# Patient Record
Sex: Male | Born: 1970 | Race: White | Hispanic: No | Marital: Married | Smoking: Never smoker
Health system: Southern US, Community
[De-identification: ages and names within clinical notes are randomized; demographics above are authoritative.]

## PROBLEM LIST (undated history)

## (undated) DIAGNOSIS — Z8774 Personal history of (corrected) congenital malformations of heart and circulatory system: Secondary | ICD-10-CM

## (undated) DIAGNOSIS — Z950 Presence of cardiac pacemaker: Secondary | ICD-10-CM

## (undated) DIAGNOSIS — I459 Conduction disorder, unspecified: Secondary | ICD-10-CM

## (undated) DIAGNOSIS — Q203 Discordant ventriculoarterial connection: Secondary | ICD-10-CM

## (undated) HISTORY — PX: EXPLORATION POST OPERATIVE OPEN HEART: SHX5061

## (undated) HISTORY — PX: CARDIAC SURGERY: SHX584

---

## 2013-08-24 ENCOUNTER — Emergency Department: Payer: Self-pay | Admitting: Emergency Medicine

## 2014-01-02 ENCOUNTER — Emergency Department: Payer: Self-pay | Admitting: Emergency Medicine

## 2014-01-02 LAB — BASIC METABOLIC PANEL
Anion Gap: 6 — ABNORMAL LOW (ref 7–16)
BUN: 19 mg/dL — ABNORMAL HIGH (ref 7–18)
CREATININE: 1.15 mg/dL (ref 0.60–1.30)
Calcium, Total: 9 mg/dL (ref 8.5–10.1)
Chloride: 108 mmol/L — ABNORMAL HIGH (ref 98–107)
Co2: 28 mmol/L (ref 21–32)
EGFR (Non-African Amer.): >60
Glucose: 104 mg/dL — ABNORMAL HIGH (ref 65–99)
OSMOLALITY: 286 (ref 275–301)
POTASSIUM: 4.4 mmol/L (ref 3.5–5.1)
Sodium: 142 mmol/L (ref 136–145)

## 2014-01-02 LAB — URIC ACID: Uric Acid: 7.7 mg/dL — ABNORMAL HIGH (ref 3.5–7.2)

## 2014-01-02 LAB — CBC
HCT: 46.5 % (ref 40.0–52.0)
HGB: 15.3 g/dL (ref 13.0–18.0)
MCH: 30.9 pg (ref 26.0–34.0)
MCHC: 33 g/dL (ref 32.0–36.0)
MCV: 93 fL (ref 80–100)
PLATELETS: 118 10*3/uL — AB (ref 150–440)
RBC: 4.97 10*6/uL (ref 4.40–5.90)
RDW: 13.8 % (ref 11.5–14.5)
WBC: 6.9 10*3/uL (ref 3.8–10.6)

## 2014-11-30 ENCOUNTER — Emergency Department: Payer: Medicaid Other

## 2014-11-30 ENCOUNTER — Emergency Department
Admission: EM | Admit: 2014-11-30 | Discharge: 2014-11-30 | Disposition: A | Discharge status: Other Acute Inpatient Hospital | Payer: Medicaid Other | Attending: Emergency Medicine | Admitting: Emergency Medicine

## 2014-11-30 ENCOUNTER — Other Ambulatory Visit: Payer: Self-pay

## 2014-11-30 ENCOUNTER — Ambulatory Visit (HOSPITAL_COMMUNITY)
Admission: AD | Admit: 2014-11-30 | Discharge: 2014-11-30 | Disposition: A | Discharge status: Home / Self Care | Payer: Medicaid Other | Source: Other Acute Inpatient Hospital | Attending: Emergency Medicine | Admitting: Emergency Medicine

## 2014-11-30 ENCOUNTER — Encounter: Payer: Self-pay | Admitting: Emergency Medicine

## 2014-11-30 DIAGNOSIS — Z79899 Other long term (current) drug therapy: Secondary | ICD-10-CM | POA: Insufficient documentation

## 2014-11-30 DIAGNOSIS — I442 Atrioventricular block, complete: Secondary | ICD-10-CM | POA: Insufficient documentation

## 2014-11-30 DIAGNOSIS — R252 Cramp and spasm: Secondary | ICD-10-CM | POA: Diagnosis not present

## 2014-11-30 DIAGNOSIS — M79652 Pain in left thigh: Secondary | ICD-10-CM | POA: Insufficient documentation

## 2014-11-30 DIAGNOSIS — R079 Chest pain, unspecified: Secondary | ICD-10-CM | POA: Diagnosis present

## 2014-11-30 LAB — COMPREHENSIVE METABOLIC PANEL
ALBUMIN: 3.8 g/dL (ref 3.5–5.0)
ALT: 26 U/L (ref 17–63)
AST: 33 U/L (ref 15–41)
Alkaline Phosphatase: 57 U/L (ref 38–126)
Anion gap: 10 (ref 5–15)
BUN: 18 mg/dL (ref 6–20)
CHLORIDE: 103 mmol/L (ref 101–111)
CO2: 23 mmol/L (ref 22–32)
CREATININE: 1.22 mg/dL (ref 0.61–1.24)
Calcium: 8.7 mg/dL — ABNORMAL LOW (ref 8.9–10.3)
GFR calc Af Amer: >60 mL/min (ref >60)
GLUCOSE: 123 mg/dL — AB (ref 65–99)
POTASSIUM: 4 mmol/L (ref 3.5–5.1)
Sodium: 136 mmol/L (ref 135–145)
Total Bilirubin: 1.4 mg/dL — ABNORMAL HIGH (ref 0.3–1.2)
Total Protein: 6.9 g/dL (ref 6.5–8.1)

## 2014-11-30 LAB — CBC WITH DIFFERENTIAL/PLATELET
BASOS ABS: 0.1 10*3/uL (ref 0–0.1)
BASOS PCT: 1 %
EOS PCT: 1 %
Eosinophils Absolute: 0.1 10*3/uL (ref 0–0.7)
HCT: 44.2 % (ref 40.0–52.0)
Hemoglobin: 15.2 g/dL (ref 13.0–18.0)
LYMPHS PCT: 15 %
Lymphs Abs: 1.1 10*3/uL (ref 1.0–3.6)
MCH: 30.5 pg (ref 26.0–34.0)
MCHC: 34.3 g/dL (ref 32.0–36.0)
MCV: 89 fL (ref 80.0–100.0)
Monocytes Absolute: 0.8 10*3/uL (ref 0.2–1.0)
Monocytes Relative: 10 %
NEUTROS ABS: 5.7 10*3/uL (ref 1.4–6.5)
Neutrophils Relative %: 73 %
PLATELETS: 117 10*3/uL — AB (ref 150–440)
RBC: 4.97 MIL/uL (ref 4.40–5.90)
RDW: 14.3 % (ref 11.5–14.5)
WBC: 7.8 10*3/uL (ref 3.8–10.6)

## 2014-11-30 LAB — MAGNESIUM: Magnesium: 1.8 mg/dL (ref 1.7–2.4)

## 2014-11-30 LAB — TROPONIN I: TROPONIN I: 0.03 ng/mL (ref <0.031)

## 2014-11-30 MED ORDER — CYCLOBENZAPRINE HCL 10 MG PO TABS
10.0000 mg | ORAL_TABLET | Freq: Three times a day (TID) | ORAL | Status: DC | PRN
Start: 2014-11-30 — End: 2015-12-14

## 2014-11-30 MED ORDER — ACETAMINOPHEN 325 MG PO TABS
650.0000 mg | ORAL_TABLET | Freq: Once | ORAL | Status: AC
  Administered 2014-11-30: 650 mg via ORAL

## 2014-11-30 MED ORDER — DIAZEPAM 5 MG/ML IJ SOLN
2.0000 mg | Freq: Once | INTRAMUSCULAR | Status: AC
  Administered 2014-11-30: 2 mg via INTRAMUSCULAR
  Filled 2014-11-30: qty 2

## 2014-11-30 MED ORDER — IBUPROFEN 800 MG PO TABS
800.0000 mg | ORAL_TABLET | Freq: Once | ORAL | Status: AC
  Administered 2014-11-30: 800 mg via ORAL
  Filled 2014-11-30: qty 1

## 2014-11-30 MED ORDER — ACETAMINOPHEN 325 MG PO TABS
ORAL_TABLET | ORAL | Status: AC
  Administered 2014-11-30: 650 mg via ORAL
  Filled 2014-11-30: qty 2

## 2014-11-30 NOTE — ED Notes (Signed)
Pt reports Saturday his left leg started getting stiff intermittently. Pt reports since then the pain has gotten worse and his leg more stiff. Pt reports now hurts to put weight on it. Denies injuries. No swelling noted. Pt reports pain is from left thigh to knee cap on left leg.

## 2014-11-30 NOTE — ED Notes (Signed)
Pt c/o left thigh pain radiating down to the left knee cap. Denies fall, area intact, no swelling noted.

## 2014-11-30 NOTE — ED Notes (Signed)
Pt continues to c/o pain in the left leg. 8/10, "throbbing".

## 2014-11-30 NOTE — ED Provider Notes (Addendum)
-----------------------------------------   5:21 PM on 11/30/2014 -----------------------------------------  Signed out to me patient in incidentally noted complete heart block, we discussed with [facility] physician Dr. Marvis Repress, who is asking for the patient to be transferred to [facility] for admission. We have called the transfer center. Patient himself is agreeable to the plan. We have ordered basic blood work to ensure that there is no electrolyte related cardiac dysfunction. The patient is in no acute distress with no complaints aside from occasional leg cramping. Strong distal pulses.   11/30/14 1722  ----------------------------------------- 5:28 PM on 11/30/2014 -----------------------------------------  I have reviewed EKG, repeat EKG does show to my read a complete heart block. No acute ischemic changes. Ventricular rate 43.  Jeanmarie Plant, MD 11/30/14 1728

## 2014-11-30 NOTE — ED Provider Notes (Addendum)
[facility] Emergency Department Provider Note  ____________________________________________  Time seen: Approximately 1020 AM  I have reviewed the triage vital signs and the nursing notes.   HISTORY  Chief Complaint Leg Pain    HPI Timothy Oconnell is a 44 y.o. 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 He says that the pain started 3 days ago when he was walking at work. He said it started as a tightening of his leg which lasted about 30 seconds and then resolved. He says that he has had increased tightening of the muscles in that leg over the past several days and now has pain with movement. He says that his wife noticed swelling to his lower extremity but he does not. Says that the pain is most concentrated to the posterior of the leg. It is described as a tightness. No numbness.   No past medical history on file.  There are no active problems to display for this patient.   Past Surgical History  Procedure Laterality Date  . Exploration post operative open heart    . Cardiac surgery      Current Outpatient Rx  Name  Route  Sig  Dispense  Refill  . omeprazole (PRILOSEC OTC) 20 MG tablet   Oral   Take 20 mg by mouth daily.           Allergies Review of patient's allergies indicates no known allergies.  No family history on file.  Social History Social History  Substance Use Topics  . Smoking status: Never Smoker   . Smokeless tobacco: Not on file  . Alcohol Use: No    Review of Systems Constitutional: No fever/chills Eyes: No visual changes. ENT: No sore throat. Cardiovascular: Denies chest pain. Respiratory: Denies shortness of breath. Gastrointestinal: No abdominal pain.  No nausea, no vomiting.  No diarrhea.  No constipation. Genitourinary: Negative for dysuria. Musculoskeletal: Negative for back pain. Skin: Negative for  rash. Neurological: Negative for headaches, focal weakness or numbness.  10-point ROS otherwise negative.  ____________________________________________   PHYSICAL EXAM:  VITAL SIGNS: ED Triage Vitals  Enc Vitals Group     BP 11/30/14 0825 154/95 mmHg     Pulse Rate 11/30/14 0825 78     Resp 11/30/14 0902 18     Temp 11/30/14 0825 97.7 F (36.5 C)     Temp Source 11/30/14 0825 Oral     SpO2 11/30/14 0825 94 %     Weight 11/30/14 0825 190 lb (86.183 kg)     Height 11/30/14 0825  (1.702 m)     Head Cir --      Peak Flow --      Pain Score 11/30/14 0826 7     Pain Loc --      Pain Edu? --      Excl. in GC? --     Constitutional: Alert and oriented. Well appearing and in no acute distress. Eyes: Conjunctivae are normal. PERRL. EOMI. Head: Atraumatic. Nose: No congestion/rhinnorhea. Mouth/Throat: Mucous membranes are moist.  Oropharynx non-erythematous. Neck: No stridor.   Cardiovascular: Normal rate, regular rhythm. Grossly normal heart sounds.  Good peripheral circulation with bilateral and intact dorsalis pedis pulses.   Respiratory: Normal respiratory effort.  No retractions. Lungs CTAB. Gastrointestinal: Soft and nontender. No distention. No abdominal bruits. No CVA tenderness. Musculoskeletal: No edema to the lower extremities bilaterally. Multiple varicosities to the bilateral  lower extremities. No ropelike structures of the posterior of the thighs or popliteal fossa. Mild tenderness to the musculature of the posterior thigh. Bilateral dorsalis pedis pulses are intact. No joint effusions. Neurologic:  Normal speech and language. No gross focal neurologic deficits are appreciated. No gait instability. Skin:  Skin is warm, dry and intact. No rash noted. Psychiatric: Mood and affect are normal. Speech and behavior are normal.  ____________________________________________   LABS (all labs ordered are listed, but only abnormal results are displayed)  Labs Reviewed -  No data to display ____________________________________________  EKG   ____________________________________________  RADIOLOGY  No evidence of DVT on the ultrasound ____________________________________________   PROCEDURES    ____________________________________________   INITIAL IMPRESSION / ASSESSMENT AND PLAN / ED COURSE  Pertinent labs & imaging results that were available during my care of the patient were reviewed by me and considered in my medical decision making (see chart for details).  ----------------------------------------- 1:02 PM on 11/30/2014 -----------------------------------------  Patient said did not have relief with Valium. Likely musculoskeletal in nature. Will use ibuprofen at home. Will also use muscle cream such as Aspercreme or icy hot. We'll also discharge with Flexeril. Patient does not a primary care but is currently in the process of applying for [facility] care at [facility]. ____________________________________________   FINAL CLINICAL IMPRESSION(S) / ED DIAGNOSES  Acute thigh pain. Initial visit.    Myrna Blazer, MD 11/30/14 1303  Patient was said to be discharged and then heart rate was found to be persistently in the upper 30s. Patient had no chest pain or shortness of breath.  ED ECG REPORT I, Arelia Longest, the attending physician, personally viewed and interpreted this ECG.   Date: 11/30/2014  EKG Time: 1337  Rate: 39  Rhythm: Idioventricular rhythm likely escape rhythm from complete heart block.  Axis: Right axis deviation  Intervals:none  ST&T Change: No ST elevations or depressions. T-wave inversions in V2 through 4.  ----------------------------------------- 345 PM on 11/30/2014 ----------------------------------------- Discussed the case with Dr. Mariah Milling of cardiology who discussed case with electrophysiologist, Dr. Graciela Husbands, who recommends that the patient follow-up as an outpatient at [facility] with Dr.Krususki.     Unclear for how long the patient has had this EKG finding. Heart rate of 50 the last time he was seen here several years ago. Patient continues to be without any symptoms of cardiac pathology including chest pain, shortness of breath.  Signed out to Dr.McShane for follow-up with [facility] cardiology to schedule follow-up appointment.  Dr. Thedora Hinders and says that he will see him in follow-up if the patient is unable to obtain follow-up at [facility]. However, it is recommended that he follow-up at [facility] for the specialist. It is possible that he has had this EKG pattern secondary to his history of transition of the great vessels.    Myrna Blazer, MD 11/30/14 [id number]

## 2015-12-14 ENCOUNTER — Emergency Department: Payer: Self-pay

## 2015-12-14 ENCOUNTER — Inpatient Hospital Stay
Admission: EM | Admit: 2015-12-14 | Discharge: 2015-12-16 | DRG: 871 | Disposition: A | Discharge status: Home / Self Care | Payer: Self-pay | Attending: Internal Medicine | Admitting: Internal Medicine

## 2015-12-14 ENCOUNTER — Encounter: Payer: Self-pay | Admitting: Emergency Medicine

## 2015-12-14 DIAGNOSIS — J45909 Unspecified asthma, uncomplicated: Secondary | ICD-10-CM | POA: Diagnosis present

## 2015-12-14 DIAGNOSIS — H53149 Visual discomfort, unspecified: Secondary | ICD-10-CM | POA: Diagnosis present

## 2015-12-14 DIAGNOSIS — I517 Cardiomegaly: Secondary | ICD-10-CM | POA: Diagnosis present

## 2015-12-14 DIAGNOSIS — Z7982 Long term (current) use of aspirin: Secondary | ICD-10-CM

## 2015-12-14 DIAGNOSIS — R69 Illness, unspecified: Secondary | ICD-10-CM

## 2015-12-14 DIAGNOSIS — R0902 Hypoxemia: Secondary | ICD-10-CM

## 2015-12-14 DIAGNOSIS — J9601 Acute respiratory failure with hypoxia: Secondary | ICD-10-CM | POA: Diagnosis present

## 2015-12-14 DIAGNOSIS — A419 Sepsis, unspecified organism: Principal | ICD-10-CM | POA: Diagnosis present

## 2015-12-14 DIAGNOSIS — J40 Bronchitis, not specified as acute or chronic: Secondary | ICD-10-CM | POA: Diagnosis present

## 2015-12-14 DIAGNOSIS — Z950 Presence of cardiac pacemaker: Secondary | ICD-10-CM

## 2015-12-14 DIAGNOSIS — R51 Headache: Secondary | ICD-10-CM | POA: Diagnosis present

## 2015-12-14 DIAGNOSIS — J209 Acute bronchitis, unspecified: Secondary | ICD-10-CM | POA: Diagnosis present

## 2015-12-14 DIAGNOSIS — I447 Left bundle-branch block, unspecified: Secondary | ICD-10-CM | POA: Diagnosis present

## 2015-12-14 DIAGNOSIS — J111 Influenza due to unidentified influenza virus with other respiratory manifestations: Secondary | ICD-10-CM

## 2015-12-14 HISTORY — DX: Conduction disorder, unspecified: I45.9

## 2015-12-14 HISTORY — DX: Personal history of (corrected) congenital malformations of heart and circulatory system: Z87.74

## 2015-12-14 HISTORY — DX: Discordant ventriculoarterial connection: Q20.3

## 2015-12-14 HISTORY — DX: Presence of cardiac pacemaker: Z95.0

## 2015-12-14 LAB — CBC WITH DIFFERENTIAL/PLATELET
BASOS PCT: 1 %
Basophils Absolute: 0.1 10*3/uL (ref 0–0.1)
EOS ABS: 0.1 10*3/uL (ref 0–0.7)
EOS PCT: 1 %
HCT: 40 % (ref 40.0–52.0)
Hemoglobin: 14 g/dL (ref 13.0–18.0)
LYMPHS ABS: 0.3 10*3/uL — AB (ref 1.0–3.6)
Lymphocytes Relative: 3 %
MCH: 30.5 pg (ref 26.0–34.0)
MCHC: 35.1 g/dL (ref 32.0–36.0)
MCV: 86.9 fL (ref 80.0–100.0)
Monocytes Absolute: 0.6 10*3/uL (ref 0.2–1.0)
Monocytes Relative: 6 %
Neutro Abs: 9.1 10*3/uL — ABNORMAL HIGH (ref 1.4–6.5)
Neutrophils Relative %: 89 %
PLATELETS: 116 10*3/uL — AB (ref 150–440)
RBC: 4.6 MIL/uL (ref 4.40–5.90)
RDW: 14.3 % (ref 11.5–14.5)
WBC: 10.1 10*3/uL (ref 3.8–10.6)

## 2015-12-14 LAB — COMPREHENSIVE METABOLIC PANEL
ALK PHOS: 52 U/L (ref 38–126)
ALT: 19 U/L (ref 17–63)
AST: 24 U/L (ref 15–41)
Albumin: 3.7 g/dL (ref 3.5–5.0)
Anion gap: 8 (ref 5–15)
BUN: 15 mg/dL (ref 6–20)
CHLORIDE: 104 mmol/L (ref 101–111)
CO2: 23 mmol/L (ref 22–32)
CREATININE: 1.03 mg/dL (ref 0.61–1.24)
Calcium: 8.7 mg/dL — ABNORMAL LOW (ref 8.9–10.3)
GFR calc Af Amer: >60 mL/min (ref >60)
Glucose, Bld: 109 mg/dL — ABNORMAL HIGH (ref 65–99)
Potassium: 3.7 mmol/L (ref 3.5–5.1)
Sodium: 135 mmol/L (ref 135–145)
Total Bilirubin: 1.6 mg/dL — ABNORMAL HIGH (ref 0.3–1.2)
Total Protein: 7.4 g/dL (ref 6.5–8.1)

## 2015-12-14 LAB — POCT RAPID STREP A: Streptococcus, Group A Screen (Direct): NEGATIVE

## 2015-12-14 LAB — INFLUENZA PANEL BY PCR (TYPE A & B)
H1N1FLUPCR: NOT DETECTED
Influenza A By PCR: NEGATIVE
Influenza B By PCR: NEGATIVE

## 2015-12-14 LAB — BRAIN NATRIURETIC PEPTIDE: B Natriuretic Peptide: 488 pg/mL — ABNORMAL HIGH (ref 0.0–100.0)

## 2015-12-14 LAB — LACTIC ACID, PLASMA: LACTIC ACID, VENOUS: 1 mmol/L (ref 0.5–1.9)

## 2015-12-14 MED ORDER — SODIUM CHLORIDE 0.9 % IV BOLUS (SEPSIS)
1000.0000 mL | Freq: Once | INTRAVENOUS | Status: AC
  Administered 2015-12-14: 1000 mL via INTRAVENOUS

## 2015-12-14 MED ORDER — ONDANSETRON HCL 4 MG PO TABS: 4.0000 mg | ORAL_TABLET | Freq: Four times a day (QID) | ORAL | Status: DC | PRN

## 2015-12-14 MED ORDER — CARVEDILOL 6.25 MG PO TABS
3.1250 mg | ORAL_TABLET | Freq: Two times a day (BID) | ORAL | Status: DC
  Administered 2015-12-15 – 2015-12-16 (×3): 3.125 mg via ORAL
  Filled 2015-12-14 (×3): qty 1

## 2015-12-14 MED ORDER — SODIUM CHLORIDE 0.9 % IV SOLN: 250.0000 mL | INTRAVENOUS | Status: DC | PRN

## 2015-12-14 MED ORDER — HYDROCOD POLST-CPM POLST ER 10-8 MG/5ML PO SUER
5.0000 mL | Freq: Once | ORAL | Status: AC
  Administered 2015-12-14: 5 mL via ORAL
  Filled 2015-12-14: qty 5

## 2015-12-14 MED ORDER — ENOXAPARIN SODIUM 40 MG/0.4ML ~~LOC~~ SOLN
40.0000 mg | SUBCUTANEOUS | Status: DC
  Filled 2015-12-14: qty 0.4

## 2015-12-14 MED ORDER — SODIUM CHLORIDE 0.9% FLUSH
3.0000 mL | Freq: Two times a day (BID) | INTRAVENOUS | Status: DC
  Administered 2015-12-15 (×2): 3 mL via INTRAVENOUS

## 2015-12-14 MED ORDER — IPRATROPIUM-ALBUTEROL 0.5-2.5 (3) MG/3ML IN SOLN
3.0000 mL | Freq: Four times a day (QID) | RESPIRATORY_TRACT | Status: DC
  Administered 2015-12-14 – 2015-12-15 (×4): 3 mL via RESPIRATORY_TRACT
  Filled 2015-12-14 (×4): qty 3

## 2015-12-14 MED ORDER — VANCOMYCIN HCL IN DEXTROSE 1-5 GM/200ML-% IV SOLN
1000.0000 mg | Freq: Once | INTRAVENOUS | Status: AC
  Administered 2015-12-14: 1000 mg via INTRAVENOUS
  Filled 2015-12-14: qty 200

## 2015-12-14 MED ORDER — ACETAMINOPHEN 325 MG PO TABS: 650.0000 mg | ORAL_TABLET | Freq: Four times a day (QID) | ORAL | Status: DC | PRN

## 2015-12-14 MED ORDER — LEVOFLOXACIN IN D5W 750 MG/150ML IV SOLN
750.0000 mg | INTRAVENOUS | Status: DC
  Administered 2015-12-14: 750 mg via INTRAVENOUS
  Filled 2015-12-14 (×2): qty 150

## 2015-12-14 MED ORDER — POLYETHYLENE GLYCOL 3350 17 G PO PACK: 17.0000 g | PACK | Freq: Every day | ORAL | Status: DC | PRN

## 2015-12-14 MED ORDER — IOPAMIDOL (ISOVUE-300) INJECTION 61%
75.0000 mL | Freq: Once | INTRAVENOUS | Status: AC | PRN
  Administered 2015-12-14: 75 mL via INTRAVENOUS

## 2015-12-14 MED ORDER — ACETAMINOPHEN 650 MG RE SUPP: 650.0000 mg | Freq: Four times a day (QID) | RECTAL | Status: DC | PRN

## 2015-12-14 MED ORDER — PANTOPRAZOLE SODIUM 40 MG PO TBEC
40.0000 mg | DELAYED_RELEASE_TABLET | Freq: Every day | ORAL | Status: DC
  Administered 2015-12-15: 40 mg via ORAL
  Filled 2015-12-14: qty 1

## 2015-12-14 MED ORDER — PIPERACILLIN-TAZOBACTAM 3.375 G IVPB 30 MIN
3.3750 g | Freq: Once | INTRAVENOUS | Status: AC
  Administered 2015-12-14: 3.375 g via INTRAVENOUS
  Filled 2015-12-14: qty 50

## 2015-12-14 MED ORDER — ALBUTEROL SULFATE (2.5 MG/3ML) 0.083% IN NEBU: 2.5000 mg | INHALATION_SOLUTION | RESPIRATORY_TRACT | Status: DC | PRN

## 2015-12-14 MED ORDER — METHYLPREDNISOLONE SODIUM SUCC 125 MG IJ SOLR
60.0000 mg | INTRAMUSCULAR | Status: DC
  Administered 2015-12-14: 60 mg via INTRAVENOUS
  Filled 2015-12-14: qty 2

## 2015-12-14 MED ORDER — SODIUM CHLORIDE 0.9% FLUSH
3.0000 mL | Freq: Two times a day (BID) | INTRAVENOUS | Status: DC
  Administered 2015-12-14 – 2015-12-15 (×2): 3 mL via INTRAVENOUS

## 2015-12-14 MED ORDER — SODIUM CHLORIDE 0.9% FLUSH: 3.0000 mL | INTRAVENOUS | Status: DC | PRN

## 2015-12-14 MED ORDER — ONDANSETRON HCL 4 MG/2ML IJ SOLN: 4.0000 mg | Freq: Four times a day (QID) | INTRAMUSCULAR | Status: DC | PRN

## 2015-12-14 NOTE — ED Notes (Signed)
Called Code Sepsis to Ardelle Ballsarelink, [place], at [phone]

## 2015-12-14 NOTE — ED Notes (Signed)
MD at bedside.  Dr. Stafford in room to assess patient.  Will continue to monitor.   

## 2015-12-14 NOTE — ED Provider Notes (Signed)
[facility] Emergency Department Provider Note  ____________________________________________  Time seen: Approximately 6:20 PM  I have reviewed the triage vital signs and the nursing notes.   HISTORY  Chief Complaint Sore Throat    HPI Timothy Oconnell is a 45 y.o. 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 He is also having a frequent cough that is occasionally productive. Also complains of photophobia, phonophobia, and generalized headache. He feels short of breath.     History reviewed. No pertinent past medical history. GERD  There are no active problems to display for this patient.    Past Surgical History:  Procedure Laterality Date  . CARDIAC SURGERY    . EXPLORATION POST OPERATIVE OPEN HEART       Prior to Admission medications   Medication Sig Start Date End Date Taking? Authorizing Provider  cyclobenzaprine (FLEXERIL) 10 MG tablet Take 1 tablet (10 mg total) by mouth 3 (three) times daily as needed for muscle spasms. 11/30/14   Myrna Blazeravid Matthew Schaevitz, MD  omeprazole (PRILOSEC OTC) 20 MG tablet Take 20 mg by mouth daily.    Historical Provider, MD     Allergies Review of patient's allergies indicates no known allergies.   No family history on file.  Social History Social History  Substance Use Topics  . Smoking status: Never Smoker  . Smokeless tobacco: Never Used  . Alcohol use No    Review of Systems  Constitutional:   Positive fever and chills.  ENT:   Positive sore throat. Cardiovascular: No chest pain. Respiratory:   Positive shortness of breath and productive cough. Gastrointestinal:   Negative for abdominal pain, vomiting and diarrhea.   Musculoskeletal:   Positive myalgias, without focal pain or swelling Neurological:   Positive for generalized headache. 10-point ROS otherwise  negative.  ____________________________________________   PHYSICAL EXAM:  VITAL SIGNS: ED Triage Vitals  Enc Vitals Group     BP 12/14/15 1345 127/83     Pulse Rate 12/14/15 1345 83     Resp 12/14/15 1345 16     Temp 12/14/15 1345 98.8 F (37.1 C)     Temp Source 12/14/15 1345 Oral     SpO2 12/14/15 1345 95 %On 2 L nasal cannula      Weight 12/14/15 1346 190 lb (86.2 kg)     Height 12/14/15 1346 5\' 10"  (1.778 m)     Head Circumference --      Peak Flow --      Pain Score 12/14/15 1346 8     Pain Loc --      Pain Edu? --      Excl. in GC? --   Room air oxygen saturation 89%  Vital signs reviewed, nursing assessments reviewed.   Constitutional:   Alert and oriented. Not in distress. Eyes:   No scleral icterus. No conjunctival pallor. PERRL. EOMI.  No nystagmus. ENT   Head:   Normocephalic and atraumatic.   Nose:   No congestion/rhinnorhea. No septal hematoma   Mouth/Throat:   MMM, no pharyngeal erythema. No peritonsillar mass.    Neck:   No stridor. No SubQ emphysema. No meningismus. Hematological/Lymphatic/Immunilogical:   No cervical lymphadenopathy. Cardiovascular:   Tachycardia heart rate 110. Symmetric bilateral radial and DP pulses.  No murmurs.  Respiratory:   Normal respiratory effort without tachypnea nor retractions. Coarse breath sounds diffusely. Room air oxygen saturation 89%, improved  to 95% on 2 L nasal cannula. Gastrointestinal:   Soft and nontender. Non distended. There is no CVA tenderness.  No rebound, rigidity, or guarding. Genitourinary:   deferred Musculoskeletal:   Nontender with normal range of motion in all extremities. No joint effusions.  No lower extremity tenderness.  No edema. Neurologic:   Normal speech and language.  CN 2-10 normal. Motor grossly intact. No gross focal neurologic deficits are appreciated.  Skin:    Skin is warm, dry and intact. No rash noted.  No petechiae, purpura, or  bullae.  ____________________________________________    LABS (pertinent positives/negatives) (all labs ordered are listed, but only abnormal results are displayed) Labs Reviewed  COMPREHENSIVE METABOLIC PANEL - Abnormal; Notable for the following:       Result Value   Glucose, Bld 109 (*)    Calcium 8.7 (*)    Total Bilirubin 1.6 (*)    All other components within normal limits  CBC WITH DIFFERENTIAL/PLATELET - Abnormal; Notable for the following:    Platelets 116 (*)    Neutro Abs 9.1 (*)    Lymphs Abs 0.3 (*)    All other components within normal limits  CULTURE, BLOOD (ROUTINE X 2)  CULTURE, BLOOD (ROUTINE X 2)  CULTURE, BLOOD (SINGLE)  INFLUENZA PANEL BY PCR (TYPE A & B, H1N1)  LACTIC ACID, PLASMA  POCT RAPID STREP A   ____________________________________________   EKG  Interpreted by me Ventricular paced, rate of 79. Right axis, left bundle branch block. No acute ischemic changes.  ____________________________________________    RADIOLOGY  Chest x-ray consistent with interstitial edema versus atypical pneumonia  ____________________________________________   PROCEDURES Procedures CRITICAL CARE Performed by: Scotty Court, Jany Buckwalter   Total critical care time: 35 minutes  Critical care time was exclusive of separately billable procedures and treating other patients.  Critical care was necessary to treat or prevent imminent or life-threatening deterioration.  Critical care was time spent personally by me on the following activities: development of treatment plan with patient and/or surrogate as well as nursing, discussions with consultants, evaluation of patient's response to treatment, examination of patient, obtaining history from patient or surrogate, ordering and performing treatments and interventions, ordering and review of laboratory studies, ordering and review of radiographic studies, pulse oximetry and re-evaluation of patient's  condition.  ____________________________________________   INITIAL IMPRESSION / ASSESSMENT AND PLAN / ED COURSE  Pertinent labs & imaging results that were available during my care of the patient were reviewed by me and considered in my medical decision making (see chart for details).  Patient presents with shortness of breath cough and constellation of symptoms consistent with influenza-like illness. However, he also has acute hypoxic respiratory failure, tachycardia, fever and complicated medical history that puts him at increased risk for a more severe course. He is doing well on supplemental oxygen with nasal cannula. Also start empiric antibiotics with vancomycin and Zosyn. Blood cultures drawn. Plan to admit for further management. IV fluids given for tachycardia. Blood pressure stable.     Clinical Course   ____________________________________________   FINAL CLINICAL IMPRESSION(S) / ED DIAGNOSES  Final diagnoses:  Sepsis, due to unspecified organism [facility])  Influenza-like illness  Acute respiratory failure with hypoxia (HCC)       Portions of this note were generated with dragon dictation software. Dictation errors may occur despite best attempts at proofreading.    Sharman Cheek, MD 12/14/15 [phone]

## 2015-12-14 NOTE — ED Notes (Signed)
Pt. In CT at this time, wife in room denied needs at this time.

## 2015-12-14 NOTE — H&P (Signed)
[facility] [place] [facility]   PATIENT NAME: Timothy Oconnell    MR#:  [id number]  DATE OF BIRTH:  Jul 20, 1970  DATE OF ADMISSION:  12/14/2015  PRIMARY CARE PHYSICIAN: No PCP Per Patient   REQUESTING/REFERRING PHYSICIAN: Dr. Scotty Court  CHIEF COMPLAINT:   Chief Complaint  Patient presents with  . Sore Throat    HISTORY OF PRESENT ILLNESS:  Timothy Oconnell  is a 45 y.o. male with a known history of Mustard procedure for transposition of great arteries, pacemaker presents to the emergency room complaining of 4 days of not feeling well, sore throat, cough with sputum and shortness of breath. Here he has been found to have temperature of 103, influenza and strep throat negative. Saturations 89% on room air. Chest x-ray showing some bronchitis. Normal lactic acid. Patient is being admitted to the hospitalist service. No recent antibiotic use or travel. Patient's son was sick with a cold a few days back and has recovered well.  PAST MEDICAL HISTORY:   Past Medical History:  Diagnosis Date  . Heart block   . History of Mustard procedure   . Pacemaker   . Transposition great arteries     PAST SURGICAL HISTORY:   Past Surgical History:  Procedure Laterality Date  . CARDIAC SURGERY    . EXPLORATION POST OPERATIVE OPEN HEART      SOCIAL HISTORY:   Social History  Substance Use Topics  . Smoking status: Never Smoker  . Smokeless tobacco: Never Used  . Alcohol use No    FAMILY HISTORY:   Family History  Problem Relation Age of Onset  . Lung cancer Father     DRUG ALLERGIES:  No Known Allergies  REVIEW OF SYSTEMS:   Review of Systems  Constitutional: Positive for chills, fever and malaise/fatigue. Negative for weight loss.  HENT: Positive for sore throat. Negative for hearing loss and nosebleeds.   Eyes: Negative for blurred vision, double vision and pain.  Respiratory: Positive for cough, sputum production, shortness of breath and  wheezing. Negative for hemoptysis.   Cardiovascular: Negative for chest pain, palpitations, orthopnea and leg swelling.  Gastrointestinal: Negative for abdominal pain, constipation, diarrhea, nausea and vomiting.  Genitourinary: Negative for dysuria and hematuria.  Musculoskeletal: Negative for back pain, falls and myalgias.  Skin: Negative for rash.  Neurological: Positive for weakness. Negative for dizziness, tremors, sensory change, speech change, focal weakness, seizures and headaches.  Endo/Heme/Allergies: Does not bruise/bleed easily.  Psychiatric/Behavioral: Negative for depression and memory loss. The patient is not nervous/anxious.     MEDICATIONS AT HOME:   Prior to Admission medications   Medication Sig Start Date End Date Taking? Authorizing Provider  cyclobenzaprine (FLEXERIL) 10 MG tablet Take 1 tablet (10 mg total) by mouth 3 (three) times daily as needed for muscle spasms. 11/30/14   Myrna Blazer, MD  omeprazole (PRILOSEC OTC) 20 MG tablet Take 20 mg by mouth daily.    Historical Provider, MD     VITAL SIGNS:  Blood pressure 118/82, pulse 95, temperature (!) 103 F (39.4 C), temperature source Oral, resp. rate 20, height 5\' 10"  (1.778 m), weight 86.2 kg (190 lb), SpO2 94 %.  PHYSICAL EXAMINATION:  Physical Exam  GENERAL:  45 y.o.-year-old patient lying in the bed with no acute distress.  EYES: Pupils equal, round, reactive to light and accommodation. No scleral icterus. Extraocular muscles intact.  HEENT: Head atraumatic, normocephalic. Oropharynx and nasopharynx clear. No oropharyngeal erythema, moist oral mucosa  NECK:  Supple,  no jugular venous distention. No thyroid enlargement, no tenderness.  LUNGS: Normal work of breathing. Bilateral wheezing. Decreased air entry. CARDIOVASCULAR: S1, S2 normal. No murmurs, rubs, or gallops.  ABDOMEN: Soft, nontender, nondistended. Bowel sounds present. No organomegaly or mass.  EXTREMITIES: No pedal edema, cyanosis,  or clubbing. + 2 pedal & radial pulses b/l.   NEUROLOGIC: Cranial nerves II through XII are intact. No focal Motor or sensory deficits appreciated b/l PSYCHIATRIC: The patient is alert and oriented x 3. Good affect.  SKIN: No obvious rash, lesion, or ulcer.   LABORATORY PANEL:   CBC  Recent Labs Lab 12/14/15 1651  WBC 10.1  HGB 14.0  HCT 40.0  PLT 116*   ------------------------------------------------------------------------------------------------------------------  Chemistries   Recent Labs Lab 12/14/15 1651  NA 135  K 3.7  CL 104  CO2 23  GLUCOSE 109*  BUN 15  CREATININE 1.03  CALCIUM 8.7*  AST 24  ALT 19  ALKPHOS 52  BILITOT 1.6*   ------------------------------------------------------------------------------------------------------------------  Cardiac Enzymes No results for input(s): TROPONINI in the last 168 hours. ------------------------------------------------------------------------------------------------------------------  RADIOLOGY:  Dg Chest 1 View  Result Date: 12/14/2015 CLINICAL DATA:  [age] y/o M; sore throat and body aches. Six months of cough. History of reconstructive heart surgery. EXAM: CHEST 1 VIEW COMPARISON:  None. FINDINGS: Severe cardiomegaly. Two lead pacemaker. Diffuse interstitial prominence. No focal consolidation. No pneumothorax or pleural effusion. Right fifth rib deformity and thickening may represent fibrous dysplasia or chronic sequelae of trauma. IMPRESSION: Cardiomegaly and interstitial prominence which probably represents interstitial edema. Atypical pneumonia is also possible. Electronically Signed   By: Mitzi HansenLance  Furusawa-Stratton M.D.   On: 12/14/2015 14:45     IMPRESSION AND PLAN:   * Acute bronchitis with acute hypoxic respiratory failure and sepsis IV bolus ordered. Sputum cultures. Blood cultures. Start Levaquin. Stat dose of Solu-Medrol and scheduled nebulizer treatment. Check CT scan of the chest to rule out  pneumonia. Check BNP  * History of pacemaker with cardiac surgery Continue Coreg. Hold lisinopril due to low normal blood pressure.  * DVT prophylaxis with Lovenox  All the records are reviewed and case discussed with ED provider. Management plans discussed with the patient, family and they are in agreement.  CODE STATUS: Full code  TOTAL TIME TAKING CARE OF THIS PATIENT: 40 minutes.   Milagros LollSudini, Sinjin Amero R M.D on 12/14/2015 at 6:52 PM  Between 7am to 6pm - Pager - [phone]  After 6pm go to [email or web address] - password [email or web address] [facility]  [facility]  Office  [phone]  CC: Primary care physician; No PCP Per Patient  Note: This dictation was prepared with Dragon dictation along with smaller phrase technology. Any transcriptional errors that result from this process are unintentional.

## 2015-12-14 NOTE — ED Triage Notes (Signed)
C/o sore throat and body aches.  

## 2015-12-14 NOTE — ED Notes (Signed)
Did EKG on pt

## 2015-12-14 NOTE — ED Provider Notes (Deleted)
[facility] Emergency Department Provider Note   ____________________________________________   First MD Initiated Contact with Patient 12/14/15 1400     (approximate)  I have reviewed the triage vital signs and the nursing notes.   HISTORY  Chief Complaint Sore Throat    HPI Timothy Oconnell is a 45 y.o. male patient plane of acute onset of body aches, chills, sore throat, and productive cough. Patient state unable to lay flat secondary to coughing. Patient states cough is not relieved with Jerilynn Som for previous prescription. Patient state can tolerate food and fluids without difficulties. Patient rates his overall pain as a 8/10. Patient describes his pain as "achy".   History reviewed. No pertinent past medical history.  There are no active problems to display for this patient.   Past Surgical History:  Procedure Laterality Date  . CARDIAC SURGERY    . EXPLORATION POST OPERATIVE OPEN HEART      Prior to Admission medications   Medication Sig Start Date End Date Taking? Authorizing Provider  cyclobenzaprine (FLEXERIL) 10 MG tablet Take 1 tablet (10 mg total) by mouth 3 (three) times daily as needed for muscle spasms. 11/30/14   Myrna Blazer, MD  omeprazole (PRILOSEC OTC) 20 MG tablet Take 20 mg by mouth daily.    Historical Provider, MD    Allergies Review of patient's allergies indicates no known allergies.  No family history on file.  Social History Social History  Substance Use Topics  . Smoking status: Never Smoker  . Smokeless tobacco: Never Used  . Alcohol use No    Review of Systems Constitutional: Chills and body aches  Eyes: No visual changes. ENT: Sore throat  Cardiovascular: Denies chest pain. History of congenital heart vessel defect. Respiratory: Denies shortness of breath. Gastrointestinal: No abdominal pain.  No nausea, no vomiting.  No diarrhea.  No constipation. Genitourinary: Negative for  dysuria. Musculoskeletal: Negative for back pain. Skin: Negative for rash. Neurological: Negative for headaches, focal weakness or numbness. Endocrine:Hypertension ____________________________________________   PHYSICAL EXAM:  VITAL SIGNS: ED Triage Vitals  Enc Vitals Group     BP 12/14/15 1345 127/83     Pulse Rate 12/14/15 1345 83     Resp 12/14/15 1345 16     Temp 12/14/15 1345 98.8 F (37.1 C)     Temp Source 12/14/15 1345 Oral     SpO2 12/14/15 1345 95 %     Weight 12/14/15 1346 190 lb (86.2 kg)     Height 12/14/15 1346 5\' 10"  (1.778 m)     Head Circumference --      Peak Flow --      Pain Score 12/14/15 1346 8     Pain Loc --      Pain Edu? --      Excl. in GC? --     Constitutional: Alert and oriented. Appears malaise Eyes: Conjunctivae are normal. PERRL. EOMI. Head: Atraumatic. Nose: No congestion/rhinnorhea. Mouth/Throat: Mucous membranes are moist.  Oropharynx non-erythematous. Neck: No stridor.  No cervical spine tenderness to palpation. Hematological/Lymphatic/Immunilogical: No cervical lymphadenopathy. Cardiovascular: Normal rate, regular rhythm. Grossly normal heart sounds.  Good peripheral circulation. Respiratory: Normal respiratory effort.  No retractions. Lungs diffuse Rales. Nonproductive cough Gastrointestinal: Soft and nontender. No distention. No abdominal bruits. No CVA tenderness. Musculoskeletal: No lower extremity tenderness nor edema.  No joint effusions. Neurologic:  Normal speech and language. No gross focal neurologic deficits are appreciated. No gait instability. Skin:  Skin is warm, dry and intact. No  rash noted. Psychiatric: Mood and affect are normal. Speech and behavior are normal.  ____________________________________________   LABS (all labs ordered are listed, but only abnormal results are displayed)  Labs Reviewed - No data to  display ____________________________________________  EKG   ____________________________________________  RADIOLOGY X-rays reveal moderate cardiomegaly no focal consolidation.  ____________________________________________   PROCEDURES  Procedure(s) performed: None  Procedures  Critical Care performed: No  ____________________________________________   INITIAL IMPRESSION / ASSESSMENT AND PLAN / ED COURSE  Pertinent labs & imaging results that were available during my care of the patient were reviewed by me and considered in my medical decision making (see chart for details). Cardiomegaly with dyspnea. Patient rapid strep and rapid flu test was negative.  Clinical Course  Patient states no improvement in his shortness of breath Friday ache or sore throat. After reassessment patient discussed with Dr. Scotty CourtStafford patient was transferred to the main side ED for definitive evaluation.   ____________________________________________   FINAL CLINICAL IMPRESSION(S) / ED DIAGNOSES  Final diagnoses:  None      NEW MEDICATIONS STARTED DURING THIS VISIT:  New Prescriptions   No medications on file     Note:  This document was prepared using Dragon voice recognition software and may include unintentional dictation errors.    Joni Reiningonald K Gentry Pilson, PA-C 12/14/15 1625    Sharman CheekPhillip Stafford, MD 12/14/15 [phone]

## 2015-12-14 NOTE — ED Notes (Signed)
Pt complains of sore throat, chills and congestion starting Friday, pt unaware if he has been running a fever

## 2015-12-15 LAB — CBC
HEMATOCRIT: 37.3 % — AB (ref 40.0–52.0)
HEMOGLOBIN: 13.3 g/dL (ref 13.0–18.0)
MCH: 30.7 pg (ref 26.0–34.0)
MCHC: 35.7 g/dL (ref 32.0–36.0)
MCV: 86.1 fL (ref 80.0–100.0)
Platelets: 87 10*3/uL — ABNORMAL LOW (ref 150–440)
RBC: 4.34 MIL/uL — ABNORMAL LOW (ref 4.40–5.90)
RDW: 14.4 % (ref 11.5–14.5)
WBC: 7 10*3/uL (ref 3.8–10.6)

## 2015-12-15 LAB — BASIC METABOLIC PANEL
Anion gap: 7 (ref 5–15)
BUN: 14 mg/dL (ref 6–20)
CALCIUM: 8.4 mg/dL — AB (ref 8.9–10.3)
CHLORIDE: 106 mmol/L (ref 101–111)
CO2: 23 mmol/L (ref 22–32)
CREATININE: 0.93 mg/dL (ref 0.61–1.24)
GFR calc Af Amer: >60 mL/min (ref >60)
GFR calc non Af Amer: >60 mL/min (ref >60)
GLUCOSE: 186 mg/dL — AB (ref 65–99)
Potassium: 3.9 mmol/L (ref 3.5–5.1)
Sodium: 136 mmol/L (ref 135–145)

## 2015-12-15 LAB — INFLUENZA PANEL BY PCR (TYPE A & B)
H1N1 flu by pcr: NOT DETECTED
Influenza A By PCR: NEGATIVE
Influenza B By PCR: NEGATIVE

## 2015-12-15 MED ORDER — LEVOFLOXACIN 750 MG PO TABS
750.0000 mg | ORAL_TABLET | Freq: Every day | ORAL | Status: DC
  Administered 2015-12-15: 750 mg via ORAL
  Filled 2015-12-15: qty 1

## 2015-12-15 MED ORDER — METHYLPREDNISOLONE SODIUM SUCC 40 MG IJ SOLR
40.0000 mg | INTRAMUSCULAR | Status: DC
  Administered 2015-12-15: 40 mg via INTRAVENOUS
  Filled 2015-12-15: qty 1

## 2015-12-15 NOTE — Progress Notes (Signed)
[facility] [place] [facility]   PATIENT NAME: Timothy OfferJohn Oconnell    MR#:  [id number]  DATE OF BIRTH:  1971/01/22  SUBJECTIVE: admitted for shortness of breath, wheezing. Today he feels much better. No wheezing, no hypoxia.   CHIEF COMPLAINT:   Chief Complaint  Patient presents with  . Sore Throat    REVIEW OF SYSTEMS:   ROS CONSTITUTIONAL: No fever, fatigue or weakness.  EYES: No blurred or double vision.  EARS, NOSE, AND THROAT: No tinnitus or ear pain.  RESPIRATORY: No cough, shortness of breath, wheezing or hemoptysis.  CARDIOVASCULAR: No chest pain, orthopnea, edema.  GASTROINTESTINAL: No nausea, vomiting, diarrhea or abdominal pain.  GENITOURINARY: No dysuria, hematuria.  ENDOCRINE: No polyuria, nocturia,  HEMATOLOGY: No anemia, easy bruising or bleeding SKIN: No rash or lesion. MUSCULOSKELETAL: No joint pain or arthritis.   NEUROLOGIC: No tingling, numbness, weakness.  PSYCHIATRY: No anxiety or depression.   DRUG ALLERGIES:  No Known Allergies  VITALS:  Blood pressure 134/79, pulse 84, temperature 98.3 F (36.8 C), temperature source Oral, resp. rate 18, height 5\' 7"  (1.702 m), weight 93.8 kg (206 lb 14.4 oz), SpO2 92 %.  PHYSICAL EXAMINATION:  GENERAL:  45 y.o.-year-old patient lying in the bed with no acute distress.  EYES: Pupils equal, round, reactive to light and accommodation. No scleral icterus. Extraocular muscles intact.  HEENT: Head atraumatic, normocephalic. Oropharynx and nasopharynx clear.  NECK:  Supple, no jugular venous distention. No thyroid enlargement, no tenderness.  LUNGS: Normal breath sounds bilaterally, no wheezing, rales,rhonchi or crepitation. No use of accessory muscles of respiration.  CARDIOVASCULAR: S1, S2 normal. No murmurs, rubs, or gallops.  ABDOMEN: Soft, nontender, nondistended. Bowel sounds present. No organomegaly or mass.  EXTREMITIES: No pedal edema, cyanosis, or clubbing.  NEUROLOGIC: Cranial  nerves II through XII are intact. Muscle strength 5/5 in all extremities. Sensation intact. Gait not checked.  PSYCHIATRIC: The patient is alert and oriented x 3.  SKIN: No obvious rash, lesion, or ulcer.    LABORATORY PANEL:   CBC  Recent Labs Lab 12/15/15 0520  WBC 7.0  HGB 13.3  HCT 37.3*  PLT 87*   ------------------------------------------------------------------------------------------------------------------  Chemistries   Recent Labs Lab 12/14/15 1651 12/15/15 0520  NA 135 136  K 3.7 3.9  CL 104 106  CO2 23 23  GLUCOSE 109* 186*  BUN 15 14  CREATININE 1.03 0.93  CALCIUM 8.7* 8.4*  AST 24  --   ALT 19  --   ALKPHOS 52  --   BILITOT 1.6*  --    ------------------------------------------------------------------------------------------------------------------  Cardiac Enzymes No results for input(s): TROPONINI in the last 168 hours. ------------------------------------------------------------------------------------------------------------------  RADIOLOGY:  Dg Chest 1 View  Result Date: 12/14/2015 CLINICAL DATA:  45 y/o M; sore throat and body aches. Six months of cough. History of reconstructive heart surgery. EXAM: CHEST 1 VIEW COMPARISON:  None. FINDINGS: Severe cardiomegaly. Two lead pacemaker. Diffuse interstitial prominence. No focal consolidation. No pneumothorax or pleural effusion. Right fifth rib deformity and thickening may represent fibrous dysplasia or chronic sequelae of trauma. IMPRESSION: Cardiomegaly and interstitial prominence which probably represents interstitial edema. Atypical pneumonia is also possible. Electronically Signed   By: Mitzi HansenLance  Furusawa-Stratton M.D.   On: 12/14/2015 14:45   Ct Chest W Contrast  Result Date: 12/14/2015 CLINICAL DATA:  Productive cough and chest pain EXAM: CT CHEST WITH CONTRAST TECHNIQUE: Multidetector CT imaging of the chest was performed during intravenous contrast administration. CONTRAST:  75mL ISOVUE-300  IOPAMIDOL (ISOVUE-300) INJECTION  61% COMPARISON:  None. FINDINGS: Cardiovascular: Thoracic aorta shows no evidence of aneurysmal dilatation or dissection. 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 Mediastinum/Nodes: There are multiple hilar and mediastinal lymph nodes identified. Largest of these measures approximately 15 mm in short axis best seen just lateral to the aortic arch on the left. The hilar lymph nodes are somewhat smaller. The esophagus as visualize is within normal limits. A moderate-sized hiatal hernia is noted. Lungs/Pleura: Small bilateral pleural effusions are seen. Mild bibasilar atelectasis is noted. No focal infiltrate is seen. Upper Abdomen: The liver is somewhat fatty infiltrated Musculoskeletal: No acute bony abnormality is noted. IMPRESSION: Postsurgical changes consistent with the given clinical history of transposition of the great vessels. Small bilateral pleural effusions with mild bibasilar atelectatic changes. Scattered hilar and mediastinal lymph nodes of uncertain significance. These may be reactive or chronic in nature. Short-term follow-up exam to assess for stability is recommended. Electronically Signed   By: Alcide Clever M.D.   On: 12/14/2015 19:49    EKG:   Orders placed or performed during the hospital encounter of 12/14/15  . EKG 12-Lead  . EKG 12-Lead  . ED EKG  . ED EKG    ASSESSMENT AND PLAN:   Acute respiratory failure with hypoxia secondary to reactive airway disease: Improved with oxygen, IV Solu-Medrol, nebulizers. CT chest negative for PE. Decreased the dose of IV steroids, likely discharge tomorrow home with tapering course of prednisone.  D/w RN and PT/,   All the records are reviewed and case discussed with Care Management/Social  Workerr. Management plans discussed with the patient, family and they are in agreement.  CODE STATUS:full  TOTAL TIME TAKING CARE OF THIS PATIENT: 35 minutes.   POSSIBLE D/C IN 1-2DAYS, DEPENDING ON CLINICAL CONDITION.   Katha Hamming M.D on 12/15/2015 at 1:31 PM  Between 7am to 6pm - Pager - [phone]  After 6pm go to [email or web address] - password [email or web address] [facility]  [place]  [facility]  Office  [phone]  CC: Primary care physician; No PCP Per Patient   Note: This dictation was prepared with Dragon dictation along with smaller phrase technology. Any transcriptional errors that result from this process are unintentional.

## 2015-12-15 NOTE — Progress Notes (Signed)
Shift assessment completed at 0730. Pt ambulating in room, on room air, lungs clear bilat. Pt stated he was wheezing on admission but not anymore. Pt denied pain. PIV #20 intact to lac, and PIV #22 intact to r hand, both sites are free of redness and swelling. Wife at bedside, awaiting md rounds. Infrequent dry cough noted.

## 2015-12-15 NOTE — Plan of Care (Signed)
Problem: Fluid Volume: Goal: Ability to maintain a balanced intake and output will improve Outcome: Progressing Pt is progressing toward goal of d/c tomorrow.

## 2015-12-15 NOTE — Progress Notes (Signed)
PHARMACIST - PHYSICIAN COMMUNICATION DR:   Luberta MutterKonidena CONCERNING: Antibiotic IV to Oral Route Change Policy  RECOMMENDATION: This patient is receiving levofloxacin by the intravenous route.  Based on criteria approved by the Pharmacy and Therapeutics Committee, the antibiotic(s) is/are being converted to the equivalent oral dose form(s).   DESCRIPTION: These criteria include:  Patient being treated for a respiratory tract infection, urinary tract infection, cellulitis or clostridium difficile associated diarrhea if on metronidazole  The patient is not neutropenic and does not exhibit a GI malabsorption state  The patient is eating (either orally or via tube) and/or has been taking other orally administered medications for a least 24 hours  The patient is improving clinically and has a Tmax < 100.5  If you have questions about this conversion, please contact the Pharmacy Department   Cindi CarbonMary M Domini Vandehei, PharmD 9:14 AM

## 2015-12-15 NOTE — Care Management (Signed)
Met with patient and his wife. They make less than <$3500/month. He does not have health insurance. He struggles to pay for medications. He does not have a PCP. He agrees to [facility] referral and assistance from [facility] mgt. Referrals have been sent to both. Patient contact [phone].

## 2015-12-16 MED ORDER — IPRATROPIUM-ALBUTEROL 0.5-2.5 (3) MG/3ML IN SOLN: 3.0000 mL | Freq: Four times a day (QID) | RESPIRATORY_TRACT | Status: DC | PRN

## 2015-12-16 MED ORDER — ALBUTEROL SULFATE HFA 108 (90 BASE) MCG/ACT IN AERS: 2.0000 | INHALATION_SPRAY | Freq: Four times a day (QID) | RESPIRATORY_TRACT | 2 refills | Status: AC | PRN

## 2015-12-16 MED ORDER — PREDNISONE 10 MG (21) PO TBPK: 10.0000 mg | ORAL_TABLET | Freq: Every day | ORAL | 0 refills | Status: DC

## 2015-12-16 MED ORDER — AZITHROMYCIN 250 MG PO TABS: ORAL_TABLET | ORAL | 0 refills | Status: DC

## 2015-12-16 MED ORDER — CARVEDILOL 3.125 MG PO TABS: 3.1250 mg | ORAL_TABLET | Freq: Two times a day (BID) | ORAL | 0 refills | Status: DC

## 2015-12-16 NOTE — Progress Notes (Signed)
Pt is discharged at this time. D/c instructions were reviewed with pt, who verbalized understanding, signed, and received copy. Pt also received work note. Pt is aware that scripts will be available at the pharmacy on d/c. PIV's removed by this writer x2 with catheters intact, pt tolerated well. Pt taken to  Front entrance of facility via wc, telemtry dc'd as well.

## 2015-12-16 NOTE — Progress Notes (Signed)
     Timothy OfferJohn Oconnell was admitted to the Hospital on 12/14/2015 and Discharged  12/16/2015 and should be excused from work/school   for 5  days starting 12/14/2015 , may return to work/school without any restrictions.can go to work on 12/19/15.    Katha HammingKONIDENA,Nanda Bittick M.D on 12/16/2015,at 10:56 AM

## 2015-12-16 NOTE — Progress Notes (Signed)
Shift assessment completed. Pt is alert and oriented, on room air, lungs are clear bilat, pt denied pain. Infrequent non productive cough. Monitor in place, v paced rhythm noted. Abdomen si soft, bs x4. Pt denied difficulty voiding,ppp, no edema. PIV #20 intact to L fa, #22 intact to r hand, both sites are free of redness and swelling. Pt is anticipating discharge today.

## 2015-12-16 NOTE — Plan of Care (Signed)
Problem: Fluid Volume: Goal: Ability to maintain a balanced intake and output will improve Outcome: Completed/Met Date Met: 12/16/15 Pt has met goals for discharge.

## 2015-12-16 NOTE — Care Management (Signed)
CM attempted to follow up with patient regarding his discharge meds.  Patient not in room and informed by primary nurse he was discharged.  Discussed that patient without payor and not able to afford meds.  Primary nurse was unaware.  Informed that scripts were e scribed to [facility]. Asked nurse if patient called back with any issues obtaining meds to call this CM asap.

## 2015-12-16 NOTE — Discharge Summary (Signed)
Makena Mcgrady, is a 45 y.o. male  DOB [birth year]  MRN [id number].  Admission date:  12/14/2015  Admitting Physician  Milagros Loll, MD  Discharge Date:  12/16/2015   Primary MD  No PCP Per Patient  Recommendations for primary care physician for things to follow:  Follow-up with her primary cardiologist at [facility]   Admission Diagnosis  Hypoxia [R09.02] Acute respiratory failure with hypoxia (HCC) [J96.01] Influenza-like illness [R69] Sepsis, due to unspecified organism [facility]) [A41.9]   Discharge Diagnosis  Hypoxia [R09.02] Acute respiratory failure with hypoxia (HCC) [J96.01] Influenza-like illness [R69] Sepsis, due to unspecified organism (HCC) [A41.9]    Active Problems:   Bronchitis      Past Medical History:  Diagnosis Date  . Heart block   . History of Mustard procedure   . Pacemaker   . Transposition great arteries     Past Surgical History:  Procedure Laterality Date  . CARDIAC SURGERY    . EXPLORATION POST OPERATIVE OPEN HEART         History of present illness and  Hospital Course:     Kindly see H&P for history of present illness and admission details, please review complete Labs, Consult reports and Test reports for all details in brief  HPI  from the history and physical done on the day of admission 45 year old male patient admitted because of cough, shortness of breath, wheezing, hypoxia with oxygen saturation 89% on room air.   Hospital Course  1 acute respiratory failure with hypoxia secondary to acute bronchitis. Chest x-ray is negative for pneumonia. Influenza titers are negative. Strep throat is negative. CT of the chest is done did not show any PE, lung cancer. In terms improved with nebulizers, IV steroids, Levaquin. WBC is not elevated. Electrolytes are within normal range. Normal  lactic acid. Blood cultures were negative. Chest CAT scan showed scattered lymph nodes likely reactive. Patient will have her primary doctor to follow up on that. 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  has history of complete heart block status post permanent pacemaker placement. Patient says the cardiologist at [facility]. And he is on aspirin for lifetime, Coreg is  Started  By cardio at [facility]. coreg started because of asymptomatic LV dysfunction. Continue lisinopril.    Discharge Condition: stable   Follow UP      Discharge Instructions  and  Discharge Medications        Medication List    TAKE these medications   albuterol 108 (90 Base) MCG/ACT inhaler Commonly known as:  PROVENTIL HFA;VENTOLIN HFA Inhale 2 puffs into the lungs every 6 (six) hours as needed for wheezing or shortness of breath.   aspirin EC 81 MG tablet Take 81 mg by mouth daily.   azithromycin 250 MG tablet Commonly known as:  ZITHROMAX Z-PAK 500 mg po on day  One followed by 250 mg po daily for 4 days   benzonatate 100 MG capsule Commonly known as:  TESSALON Take 100 mg by mouth 3 (three) times daily as needed for cough.   carvedilol 3.125 MG tablet Commonly known as:  COREG Take 1 tablet (3.125 mg total) by mouth 2 (two) times daily.   ibuprofen 200 MG tablet Commonly known as:  ADVIL,MOTRIN Take 600 mg by mouth every 6 (six) hours as needed.   lisinopril 2.5 MG tablet Commonly known as:  PRINIVIL,ZESTRIL Take 2.5 mg by mouth daily.   omeprazole 20 MG  tablet Commonly known as:  PRILOSEC OTC Take 20 mg by mouth daily.   predniSONE 10 MG (21) Tbpk tablet Commonly known as:  STERAPRED UNI-PAK 21 TAB Take 1 tablet (10 mg total) by mouth daily. Taper by 10 mg daily         Diet and Activity recommendation: See Discharge Instructions above   Consults obtained - none   Major procedures and Radiology Reports -  PLEASE review detailed and final reports for all details, in brief -     Dg Chest 1 View  Result Date: 12/14/2015 CLINICAL DATA:  45 y/o M; sore throat and body aches. Six months of cough. History of reconstructive heart surgery. EXAM: CHEST 1 VIEW COMPARISON:  None. FINDINGS: Severe cardiomegaly. Two lead pacemaker. Diffuse interstitial prominence. No focal consolidation. No pneumothorax or pleural effusion. Right fifth rib deformity and thickening may represent fibrous dysplasia or chronic sequelae of trauma. IMPRESSION: Cardiomegaly and interstitial prominence which probably represents interstitial edema. Atypical pneumonia is also possible. Electronically Signed   By: Mitzi HansenLance  Furusawa-Stratton M.D.   On: 12/14/2015 14:45   Ct Chest W Contrast  Result Date: 12/14/2015 CLINICAL DATA:  Productive cough and chest pain EXAM: CT CHEST WITH CONTRAST TECHNIQUE: Multidetector CT imaging of the chest was performed during intravenous contrast administration. CONTRAST:  75mL ISOVUE-300 IOPAMIDOL (ISOVUE-300) INJECTION 61% COMPARISON:  None. FINDINGS: Cardiovascular: Thoracic aorta shows no evidence of aneurysmal dilatation or dissection. 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 Mediastinum/Nodes: There are multiple hilar and mediastinal lymph nodes identified. Largest of these measures approximately 15 mm in short axis best seen just lateral to the aortic arch on the left. The hilar lymph nodes are somewhat smaller. The esophagus as visualize is within normal limits. A moderate-sized hiatal hernia is noted. Lungs/Pleura: Small bilateral pleural effusions are seen. Mild bibasilar atelectasis is noted. No focal infiltrate is seen. Upper Abdomen: The liver is somewhat fatty infiltrated Musculoskeletal: No acute  bony abnormality is noted. IMPRESSION: Postsurgical changes consistent with the given clinical history of transposition of the great vessels. Small bilateral pleural effusions with mild bibasilar atelectatic changes. Scattered hilar and mediastinal lymph nodes of uncertain significance. These may be reactive or chronic in nature. Short-term follow-up exam to assess for stability is recommended. Electronically Signed   By: Alcide CleverMark  Lukens M.D.   On: 12/14/2015 19:49    Micro Results    No results found for this or any previous visit (from the past 240 hour(s)).     Today   Subjective:   Michaele OfferJohn Holwerda today has no headache,no chest abdominal pain,no new weakness tingling or numbness, feels much better wants to go home today.  Objective:   Blood pressure 129/80, pulse 79, temperature 97.6 F (36.4 C), temperature source Oral, resp. rate 20, height 5\' 7"  (1.702 m), weight 92.3 kg (203 lb 6.4 oz), SpO2 97 %.  No intake or output data in the 24 hours ending 12/16/15 1049  Exam Awake Alert, Oriented x 3, No new F.N deficits, Normal affect [place].AT,PERRAL Supple Neck,No JVD, No cervical lymphadenopathy appriciated.  Symmetrical Chest wall movement, Good air movement bilaterally, CTAB RRR,No Gallops,Rubs or new Murmurs, No Parasternal Heave +ve B.Sounds, Abd Soft, Non tender, No organomegaly appriciated, No rebound -guarding or rigidity. No Cyanosis, Clubbing or edema, No new Rash or bruise  Data Review  CBC w Diff:  Lab Results  Component Value Date   WBC 7.0 12/15/2015   HGB 13.3 12/15/2015   HGB 15.3 01/02/2014   HCT 37.3 (L) 12/15/2015   HCT 46.5 01/02/2014   PLT 87 (L) 12/15/2015   PLT 118 (L) 01/02/2014   LYMPHOPCT 3 12/14/2015   MONOPCT 6 12/14/2015   EOSPCT 1 12/14/2015   BASOPCT 1 12/14/2015    CMP:  Lab Results  Component Value Date   NA 136 12/15/2015   NA 142 01/02/2014   K 3.9 12/15/2015   K 4.4 01/02/2014   CL 106 12/15/2015   CL 108 (H) 01/02/2014   CO2  23 12/15/2015   CO2 28 01/02/2014   BUN 14 12/15/2015   BUN 19 (H) 01/02/2014   CREATININE 0.93 12/15/2015   CREATININE 1.15 01/02/2014   PROT 7.4 12/14/2015   ALBUMIN 3.7 12/14/2015   BILITOT 1.6 (H) 12/14/2015   ALKPHOS 52 12/14/2015   AST 24 12/14/2015   ALT 19 12/14/2015  .   Total Time in preparing paper work, data evaluation and todays exam - 35 minutes  Yaneli Keithley M.D on 12/16/2015 at 10:49 AM    Note: This dictation was prepared with Dragon dictation along with smaller phrase technology. Any transcriptional errors that result from this process are unintentional.

## 2016-03-02 LAB — CULTURE, BLOOD (SINGLE): Culture: NO GROWTH

## 2016-03-02 LAB — CULTURE, BLOOD (ROUTINE X 2)
CULTURE: NO GROWTH
Culture: NO GROWTH

## 2017-09-10 ENCOUNTER — Emergency Department: Payer: Self-pay

## 2017-09-10 ENCOUNTER — Inpatient Hospital Stay
Admission: EM | Admit: 2017-09-10 | Discharge: 2017-09-12 | DRG: 683 | Disposition: A | Discharge status: Home / Self Care | Payer: Self-pay | Attending: Internal Medicine | Admitting: Internal Medicine

## 2017-09-10 ENCOUNTER — Other Ambulatory Visit: Payer: Self-pay

## 2017-09-10 ENCOUNTER — Encounter: Payer: Self-pay | Admitting: *Deleted

## 2017-09-10 DIAGNOSIS — R079 Chest pain, unspecified: Secondary | ICD-10-CM | POA: Diagnosis present

## 2017-09-10 DIAGNOSIS — Z79899 Other long term (current) drug therapy: Secondary | ICD-10-CM

## 2017-09-10 DIAGNOSIS — E875 Hyperkalemia: Secondary | ICD-10-CM | POA: Diagnosis not present

## 2017-09-10 DIAGNOSIS — I200 Unstable angina: Secondary | ICD-10-CM | POA: Diagnosis present

## 2017-09-10 DIAGNOSIS — N400 Enlarged prostate without lower urinary tract symptoms: Secondary | ICD-10-CM | POA: Diagnosis present

## 2017-09-10 DIAGNOSIS — R161 Splenomegaly, not elsewhere classified: Secondary | ICD-10-CM | POA: Diagnosis present

## 2017-09-10 DIAGNOSIS — Z801 Family history of malignant neoplasm of trachea, bronchus and lung: Secondary | ICD-10-CM

## 2017-09-10 DIAGNOSIS — I442 Atrioventricular block, complete: Secondary | ICD-10-CM | POA: Diagnosis present

## 2017-09-10 DIAGNOSIS — I5032 Chronic diastolic (congestive) heart failure: Secondary | ICD-10-CM | POA: Diagnosis present

## 2017-09-10 DIAGNOSIS — N170 Acute kidney failure with tubular necrosis: Principal | ICD-10-CM | POA: Diagnosis present

## 2017-09-10 DIAGNOSIS — I110 Hypertensive heart disease with heart failure: Secondary | ICD-10-CM | POA: Diagnosis present

## 2017-09-10 DIAGNOSIS — I959 Hypotension, unspecified: Secondary | ICD-10-CM | POA: Diagnosis present

## 2017-09-10 DIAGNOSIS — K219 Gastro-esophageal reflux disease without esophagitis: Secondary | ICD-10-CM | POA: Diagnosis present

## 2017-09-10 DIAGNOSIS — Z950 Presence of cardiac pacemaker: Secondary | ICD-10-CM

## 2017-09-10 DIAGNOSIS — N179 Acute kidney failure, unspecified: Secondary | ICD-10-CM | POA: Diagnosis present

## 2017-09-10 DIAGNOSIS — Z7982 Long term (current) use of aspirin: Secondary | ICD-10-CM

## 2017-09-10 DIAGNOSIS — Z8774 Personal history of (corrected) congenital malformations of heart and circulatory system: Secondary | ICD-10-CM

## 2017-09-10 DIAGNOSIS — E860 Dehydration: Secondary | ICD-10-CM | POA: Diagnosis present

## 2017-09-10 LAB — BRAIN NATRIURETIC PEPTIDE: B Natriuretic Peptide: 46 pg/mL (ref 0.0–100.0)

## 2017-09-10 LAB — CBC WITH DIFFERENTIAL/PLATELET
Basophils Absolute: 0.1 10*3/uL (ref 0–0.1)
Basophils Relative: 1 %
Eosinophils Absolute: 0.1 10*3/uL (ref 0–0.7)
Eosinophils Relative: 1 %
HEMATOCRIT: 34.2 % — AB (ref 40.0–52.0)
HEMOGLOBIN: 12 g/dL — AB (ref 13.0–18.0)
LYMPHS ABS: 0.9 10*3/uL — AB (ref 1.0–3.6)
LYMPHS PCT: 10 %
MCH: 32 pg (ref 26.0–34.0)
MCHC: 35.2 g/dL (ref 32.0–36.0)
MCV: 90.9 fL (ref 80.0–100.0)
MONOS PCT: 8 %
Monocytes Absolute: 0.7 10*3/uL (ref 0.2–1.0)
NEUTROS ABS: 7.3 10*3/uL — AB (ref 1.4–6.5)
Neutrophils Relative %: 80 %
Platelets: 179 10*3/uL (ref 150–440)
RBC: 3.76 MIL/uL — ABNORMAL LOW (ref 4.40–5.90)
RDW: 15.2 % — ABNORMAL HIGH (ref 11.5–14.5)
WBC: 9.1 10*3/uL (ref 3.8–10.6)

## 2017-09-10 LAB — COMPREHENSIVE METABOLIC PANEL
ALK PHOS: 58 U/L (ref 38–126)
ALT: 20 U/L (ref 0–44)
AST: 20 U/L (ref 15–41)
Albumin: 4.1 g/dL (ref 3.5–5.0)
Anion gap: 13 (ref 5–15)
BUN: 98 mg/dL — AB (ref 6–20)
CO2: 12 mmol/L — AB (ref 22–32)
Calcium: 9.6 mg/dL (ref 8.9–10.3)
Chloride: 111 mmol/L (ref 98–111)
Creatinine, Ser: 5.17 mg/dL — ABNORMAL HIGH (ref 0.61–1.24)
GFR calc non Af Amer: 12 mL/min — ABNORMAL LOW (ref >60)
GFR, EST AFRICAN AMERICAN: 14 mL/min — AB (ref >60)
GLUCOSE: 120 mg/dL — AB (ref 70–99)
Potassium: 5.6 mmol/L — ABNORMAL HIGH (ref 3.5–5.1)
SODIUM: 136 mmol/L (ref 135–145)
Total Bilirubin: 1.3 mg/dL — ABNORMAL HIGH (ref 0.3–1.2)
Total Protein: 7.9 g/dL (ref 6.5–8.1)

## 2017-09-10 LAB — TROPONIN I: Troponin I: 0.04 ng/mL (ref <0.03)

## 2017-09-10 MED ORDER — SODIUM CHLORIDE 0.9 % IV SOLN
Freq: Once | INTRAVENOUS | Status: AC
  Administered 2017-09-10: 200 mL/h via INTRAVENOUS

## 2017-09-10 NOTE — ED Provider Notes (Signed)
[facility] Emergency Department Provider Note   ____________________________________________   First MD Initiated Contact with Patient 09/10/17 2226     (approximate)  I have reviewed the triage vital signs and the nursing notes.   HISTORY  Chief Complaint Chest Pain   HPI Delonta Yohannes is a 47 y.o. 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  He reports that 2 to 3 days ago he began having squeezing type chest pain associated with nausea and dizziness when he walks 5 or 10 feet.  He can walk 5 feet pain will come on he will stop for a minute the pain gets better he walks again the pain gets worse again.  He has not had this before.  The only thing it really seems to bring a day on his walking.  Normally he walks 3 or more miles a day.  He had a history of prediabetes but does not smoke or have any other medical problems.  His father died of complications of cancer involving the heart..  Past Medical History:  Diagnosis Date  . Heart block   . History of Mustard procedure   . Pacemaker   . Transposition great arteries     Patient Active Problem List   Diagnosis Date Noted  . Bronchitis 12/14/2015    Past Surgical History:  Procedure Laterality Date  . CARDIAC SURGERY    . EXPLORATION POST OPERATIVE OPEN HEART      Prior to Admission medications   Medication Sig Start Date End Date Taking? Authorizing Provider  albuterol (PROVENTIL HFA;VENTOLIN HFA) 108 (90 Base) MCG/ACT inhaler Inhale 2 puffs into the lungs every 6 (six) hours as needed for wheezing or shortness of breath. 12/16/15  Yes Katha Hamming, MD  aspirin EC 81 MG tablet Take 81 mg by mouth daily.   Yes [provider]  calcium carbonate (TUMS - DOSED IN MG ELEMENTAL CALCIUM) 500 MG chewable tablet Chew 1 tablet by mouth 2 (two) times daily as needed for indigestion or heartburn.   Yes [provider]  carvedilol (COREG) 6.25 MG tablet Take 6.25-12.5 mg by mouth 2 (two) times daily. 6.25mg  in the morning and 12.5mg  at bedtime 08/28/17  Yes [provider]  furosemide (LASIX) 40 MG tablet Take 40 mg by mouth daily. 08/07/17  Yes [provider]  ibuprofen (ADVIL,MOTRIN) 200 MG tablet Take 200-400 mg by mouth every 6 (six) hours as needed for mild pain.    Yes [provider]  lisinopril (PRINIVIL,ZESTRIL) 20 MG tablet Take 20 mg by mouth daily. 08/07/17  Yes [provider]  Multiple Vitamin (MULTIVITAMIN WITH MINERALS) TABS tablet Take 1 tablet by mouth daily.   Yes [provider]  omeprazole (PRILOSEC OTC) 20 MG tablet Take 20 mg by mouth daily.   Yes [provider]  spironolactone (ALDACTONE) 25 MG tablet Take 25 mg by mouth daily. 08/07/17  Yes [provider]  azithromycin (ZITHROMAX Z-PAK) 250 MG tablet 500 mg po on day  One followed by 250 mg po daily for 4 days Patient not taking: Reported on 09/10/2017 12/16/15   Katha Hamming, MD  carvedilol (COREG) 3.125 MG tablet Take 1 tablet (3.125 mg total) by mouth 2 (two) times daily. 12/16/15 12/15/16  Katha Hamming, MD  predniSONE (STERAPRED UNI-PAK 21 TAB) 10 MG (21) TBPK tablet Take 1 tablet (10 mg total) by mouth daily. Taper by 10 mg daily  Patient not taking: Reported on 09/10/2017 12/16/15   Katha Hamming, MD    Allergies Patient has no known allergies.  Family History  Problem Relation Age of Onset  . Lung cancer Father     Social History Social History   Tobacco Use  . Smoking status: Never Smoker  . Smokeless tobacco: Never Used  Substance Use Topics  . Alcohol use: No  . Drug use: Not on file    Review of Systems  Constitutional: No fever/chills Eyes: No visual changes. ENT: No sore throat. Cardiovascular:chest pain. Respiratory: No pleuritic chest pain Gastrointestinal: No abdominal pain.  No nausea, no vomiting.  No diarrhea.  No  constipation. Genitourinary: Negative for dysuria. Musculoskeletal: Negative for back pain. Skin: Negative for rash. Neurological: Negative for headaches, focal weakness   ____________________________________________   PHYSICAL EXAM:  VITAL SIGNS: ED Triage Vitals  Enc Vitals Group     BP      Pulse      Resp      Temp      Temp src      SpO2      Weight      Height      Head Circumference      Peak Flow      Pain Score      Pain Loc      Pain Edu?      Excl. in GC?     Constitutional: Alert and oriented. Well appearing and in no acute distress. Eyes: Conjunctivae are normal. PERRL. EOMI. Head: Atraumatic. Nose: No congestion/rhinnorhea. Mouth/Throat: Mucous membranes are moist.  Oropharynx non-erythematous. Neck: No stridor.   Cardiovascular: Normal rate, regular rhythm. Grossly normal heart sounds.  Good peripheral circulation. Respiratory: Normal respiratory effort.  No retractions. Lungs CTAB. Gastrointestinal: Soft and nontender. No distention. No abdominal bruits. No CVA tenderness. Musculoskeletal: No lower extremity tenderness nor edema.  No joint effusions. Neurologic:  Normal speech and language. No gross focal neurologic deficits are appreciated. . Skin:  Skin is warm, dry and intact. No rash noted. Psychiatric: Mood and affect are normal. Speech and behavior are normal.  ____________________________________________   LABS (all labs ordered are listed, but only abnormal results are displayed)  Labs Reviewed  COMPREHENSIVE METABOLIC PANEL - Abnormal; Notable for the following components:      Result Value   Potassium 5.6 (*)    CO2 12 (*)    Glucose, Bld 120 (*)    BUN 98 (*)    Creatinine, Ser 5.17 (*)    Total Bilirubin 1.3 (*)    GFR calc non Af Amer 12 (*)    GFR calc Af Amer 14 (*)    All other components within normal limits  TROPONIN I - Abnormal; Notable for the following components:   Troponin I 0.04 (*)    All other components within  normal limits  CBC WITH DIFFERENTIAL/PLATELET - Abnormal; Notable for the following components:   RBC 3.76 (*)    Hemoglobin 12.0 (*)    HCT 34.2 (*)    RDW 15.2 (*)    Neutro Abs 7.3 (*)    Lymphs Abs 0.9 (*)    All other components within normal limits  BRAIN NATRIURETIC PEPTIDE   ____________________________________________  EKG  _EKG read interpreted by me shows dual paced rhythm complexes looks similar to one from October 2017 ___________________________________________  RADIOLOGY  ED MD interpretation:    Official radiology report(s): Dg Chest Portable 1 View  Result Date: 09/10/2017 CLINICAL DATA:  Persistent neck pain and shoulder pain for 1 week. Intermittent chest pain for 3 days. Shortness of breath and dizziness while walking. EXAM: PORTABLE CHEST 1 VIEW COMPARISON:  12/14/2015 FINDINGS: Cardiac pacemaker. Cardiac enlargement. Pulmonary vascularity is normal. No airspace disease or consolidation in the lungs. No blunting of costophrenic angles. No pneumothorax. Mediastinal contours appear intact. Old right rib deformities. No change since previous study. IMPRESSION: Cardiac enlargement.  No evidence of active pulmonary disease. Electronically Signed   By: Burman NievesWilliam  Stevens M.D.   On: 09/10/2017 22:42    ____________________________________________   PROCEDURES  Procedure(s) performed:   Procedures  Critical Care performed:   ____________________________________________   INITIAL IMPRESSION / ASSESSMENT AND PLAN / ED COURSE pT SEES [facility] pEDS cARDS IN [place]. I CALLED [facility] BUT THEY DO NOT HAVE PRIVILEDGES THERE. [facility] CURRENTLY HAS NO BEDS. pT GETTING fluids @ 200 an hr. [facility] fellow has more recent labs showing a creat of 1.17 in Dec.  We will watch him here try and increase his fluids he is on a waiting list for transfer to [facility] at this time.        ____________________________________________   FINAL CLINICAL IMPRESSION(S) / ED  DIAGNOSES  Final diagnoses:  Hypotension, unspecified hypotension type  AKI (acute kidney injury) (HCC)  Unstable angina pectoris [facility](HCC)     ED Discharge Orders    None       Note:  This document was prepared using Dragon voice recognition software and may include unintentional dictation errors.    Arnaldo NatalMalinda, Haywood Meinders F, MD 09/11/17 [id number]

## 2017-09-10 NOTE — ED Notes (Signed)
Date and time results received: 09/10/17 2305 (use smartphrase ".now" to insert current time)  Test: TropI Critical Value: 0.04  Name of Provider Notified: Malinda  Orders Received? Or Actions Taken?: No new orders at this time

## 2017-09-10 NOTE — ED Triage Notes (Signed)
Pt presents w/ c/o chest pain. Pt states he has had persistant pain in neck and shoulders x > 1 week. Pt states chest pain intermittently x 2-3 days. Pt states he attributed his pain to the heat. Pt has extensive and complex heart history. Pt is followed by [facility]. Pt states while he was walking home, he pasued to take a break and became dizzy, nearly falling. Pt denies shortness of breath at this time, but states he was short of breath when he became dizzy while walking.

## 2017-09-11 ENCOUNTER — Inpatient Hospital Stay
Admit: 2017-09-11 | Discharge: 2017-09-11 | Disposition: A | Discharge status: Home / Self Care | Payer: Self-pay | Attending: Internal Medicine | Admitting: Internal Medicine

## 2017-09-11 ENCOUNTER — Inpatient Hospital Stay: Payer: Self-pay

## 2017-09-11 DIAGNOSIS — N179 Acute kidney failure, unspecified: Secondary | ICD-10-CM | POA: Diagnosis present

## 2017-09-11 DIAGNOSIS — Z8774 Personal history of (corrected) congenital malformations of heart and circulatory system: Secondary | ICD-10-CM

## 2017-09-11 DIAGNOSIS — R079 Chest pain, unspecified: Secondary | ICD-10-CM | POA: Diagnosis present

## 2017-09-11 DIAGNOSIS — I442 Atrioventricular block, complete: Secondary | ICD-10-CM | POA: Diagnosis present

## 2017-09-11 LAB — CBC
HCT: 32.7 % — ABNORMAL LOW (ref 40.0–52.0)
Hemoglobin: 11.5 g/dL — ABNORMAL LOW (ref 13.0–18.0)
MCH: 32 pg (ref 26.0–34.0)
MCHC: 35.1 g/dL (ref 32.0–36.0)
MCV: 91.1 fL (ref 80.0–100.0)
Platelets: 136 10*3/uL — ABNORMAL LOW (ref 150–440)
RBC: 3.59 MIL/uL — ABNORMAL LOW (ref 4.40–5.90)
RDW: 15 % — ABNORMAL HIGH (ref 11.5–14.5)
WBC: 7.2 10*3/uL (ref 3.8–10.6)

## 2017-09-11 LAB — BASIC METABOLIC PANEL
Anion gap: 11 (ref 5–15)
BUN: 94 mg/dL — ABNORMAL HIGH (ref 6–20)
CALCIUM: 9.3 mg/dL (ref 8.9–10.3)
CO2: 13 mmol/L — AB (ref 22–32)
CREATININE: 4.54 mg/dL — AB (ref 0.61–1.24)
Chloride: 113 mmol/L — ABNORMAL HIGH (ref 98–111)
GFR, EST AFRICAN AMERICAN: 16 mL/min — AB (ref >60)
GFR, EST NON AFRICAN AMERICAN: 14 mL/min — AB (ref >60)
GLUCOSE: 95 mg/dL (ref 70–99)
Potassium: 5.6 mmol/L — ABNORMAL HIGH (ref 3.5–5.1)
Sodium: 137 mmol/L (ref 135–145)

## 2017-09-11 LAB — URINALYSIS, ROUTINE W REFLEX MICROSCOPIC
Bilirubin Urine: NEGATIVE
Glucose, UA: NEGATIVE mg/dL
Hgb urine dipstick: NEGATIVE
KETONES UR: NEGATIVE mg/dL
LEUKOCYTES UA: NEGATIVE
NITRITE: NEGATIVE
PROTEIN: NEGATIVE mg/dL
Specific Gravity, Urine: 1.009 (ref 1.005–1.030)
pH: 5 (ref 5.0–8.0)

## 2017-09-11 LAB — TROPONIN I
TROPONIN I: 0.03 ng/mL — AB (ref <0.03)
Troponin I: 0.03 ng/mL (ref <0.03)

## 2017-09-11 LAB — ECHOCARDIOGRAM COMPLETE
HEIGHTINCHES: 67 in
Weight: 2969.6 oz

## 2017-09-11 MED ORDER — ONDANSETRON HCL 4 MG PO TABS: 4.0000 mg | ORAL_TABLET | Freq: Four times a day (QID) | ORAL | Status: DC | PRN

## 2017-09-11 MED ORDER — ACETAMINOPHEN 325 MG PO TABS
650.0000 mg | ORAL_TABLET | Freq: Four times a day (QID) | ORAL | Status: DC | PRN
  Administered 2017-09-11: 650 mg via ORAL
  Filled 2017-09-11: qty 2

## 2017-09-11 MED ORDER — ONDANSETRON HCL 4 MG/2ML IJ SOLN: 4.0000 mg | Freq: Four times a day (QID) | INTRAMUSCULAR | Status: DC | PRN

## 2017-09-11 MED ORDER — HEPARIN SODIUM (PORCINE) 5000 UNIT/ML IJ SOLN
5000.0000 [IU] | Freq: Three times a day (TID) | INTRAMUSCULAR | Status: DC
  Administered 2017-09-11: 5000 [IU] via SUBCUTANEOUS
  Filled 2017-09-11: qty 1

## 2017-09-11 MED ORDER — PANTOPRAZOLE SODIUM 40 MG PO TBEC
40.0000 mg | DELAYED_RELEASE_TABLET | Freq: Every day | ORAL | Status: DC
  Administered 2017-09-11 – 2017-09-12 (×2): 40 mg via ORAL
  Filled 2017-09-11 (×2): qty 1

## 2017-09-11 MED ORDER — SODIUM CHLORIDE 0.9 % IV SOLN
INTRAVENOUS | Status: DC
Start: 2017-09-11 — End: 2017-09-12
  Administered 2017-09-11 – 2017-09-12 (×4): via INTRAVENOUS

## 2017-09-11 MED ORDER — ASPIRIN EC 81 MG PO TBEC
81.0000 mg | DELAYED_RELEASE_TABLET | Freq: Every day | ORAL | Status: DC
  Administered 2017-09-11 – 2017-09-12 (×2): 81 mg via ORAL
  Filled 2017-09-11 (×2): qty 1

## 2017-09-11 MED ORDER — OMEPRAZOLE MAGNESIUM 20 MG PO TBEC: 20.0000 mg | DELAYED_RELEASE_TABLET | Freq: Every day | ORAL | Status: DC

## 2017-09-11 MED ORDER — TAMSULOSIN HCL 0.4 MG PO CAPS
0.4000 mg | ORAL_CAPSULE | Freq: Every day | ORAL | Status: DC
  Administered 2017-09-11: 0.4 mg via ORAL
  Filled 2017-09-11: qty 1

## 2017-09-11 MED ORDER — ACETAMINOPHEN 650 MG RE SUPP: 650.0000 mg | Freq: Four times a day (QID) | RECTAL | Status: DC | PRN

## 2017-09-11 MED ORDER — ENOXAPARIN SODIUM 30 MG/0.3ML ~~LOC~~ SOLN: 30.0000 mg | SUBCUTANEOUS | Status: DC

## 2017-09-11 NOTE — Progress Notes (Signed)
Patient was transferred from the ER following c/o chest pain. On admission patient was A&O X4,ambulatory,  denied chest pain, but endorsed shoulder pain. Patient was oriented to his room and care plan reviewed with patient. Patient has no acute event overnight, remained AVpaced in the low 60s.

## 2017-09-11 NOTE — Progress Notes (Signed)
*  PRELIMINARY RESULTS* Echocardiogram 2D Echocardiogram has been performed.  Timothy Oconnell, Timothy Oconnell 09/11/2017, 9:46 AM

## 2017-09-11 NOTE — Progress Notes (Signed)
Patient found to have 8 bottles of prescription medications in the room that apparently his wife brought him today and then left. Medications bagged and documented and gave to floor pharmacist Melissa to take down to pharmacy later on. Reminder slip placed in chart and on patient's whiteboard. Will continue to monitor. Jari FavreSteven M [facility]

## 2017-09-11 NOTE — Consult Note (Signed)
Date: 09/11/2017                  Patient Name:  Timothy Oconnell  MRN: [id number]  DOB: 1970/04/15  Age / Sex: 47 y.o., male         PCP: Patient, No Pcp Per                 Service Requesting Consult: IM/ Houston Siren, MD                 Reason for Consult: ARF            History of Present Illness: Patient is a 47 y.o. 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  He does not like to drink too much water but associates thirst for drinking water with some type of systemic illness.  He presents for feeling extremely fatigued and had poor appetite.  In the emergency room his creatinine was noted to be elevated at 5.17, BUN 98.  His baseline appears to be 0.93 from October 2017.  He was given IV fluids.  This morning, serum creatinine has improved slightly to 4.54 Renal ultrasound this morning is negative for obstruction.  However incidentally, he was found to have an enlarged spleen and enlarged prostate. Urinalysis has been ordered.  Results are pending.  Blood pressure this morning is low at 99/52    Medications: Outpatient medications: Medications Prior to Admission  Medication Sig Dispense Refill Last Dose  . albuterol (PROVENTIL HFA;VENTOLIN HFA) 108 (90 Base) MCG/ACT inhaler Inhale 2 puffs into the lungs every 6 (six) hours as needed for wheezing or shortness of breath. 1 Inhaler 2 prn at prn  . aspirin EC 81 MG tablet Take 81 mg by mouth daily.   09/10/2017 at Unknown time  . calcium carbonate (TUMS - DOSED IN MG ELEMENTAL CALCIUM) 500 MG chewable tablet Chew 1 tablet by mouth 2 (two) times daily as needed for  indigestion or heartburn.   prn at prn  . carvedilol (COREG) 6.25 MG tablet Take 6.25-12.5 mg by mouth 2 (two) times daily. 6.25mg  in the morning and 12.5mg  at bedtime  11 09/10/2017 at 0900  . furosemide (LASIX) 40 MG tablet Take 40 mg by mouth daily.  11 09/10/2017 at Unknown time  . ibuprofen (ADVIL,MOTRIN) 200 MG tablet Take 200-400 mg by mouth every 6 (six) hours as needed for mild pain.    prn at prn  . lisinopril (PRINIVIL,ZESTRIL) 20 MG tablet Take 20 mg by mouth daily.  11 09/10/2017 at Unknown time  . Multiple Vitamin (MULTIVITAMIN WITH MINERALS) TABS tablet Take 1 tablet by mouth daily.   09/10/2017 at Unknown time  . omeprazole (PRILOSEC OTC) 20 MG tablet Take 20 mg by mouth daily.   09/10/2017 at Unknown time  . spironolactone (ALDACTONE) 25 MG tablet Take 25 mg by mouth daily.  11 09/10/2017 at Unknown time  . azithromycin (ZITHROMAX Z-PAK) 250 MG tablet 500 mg po on day  One followed by 250 mg po daily for 4 days (Patient not taking: Reported on 09/10/2017) 6 each 0 Completed Course at Unknown time  . carvedilol (COREG) 3.125 MG tablet Take 1 tablet (  3.125 mg total) by mouth 2 (two) times daily. 60 tablet 0   . predniSONE (STERAPRED UNI-PAK 21 TAB) 10 MG (21) TBPK tablet Take 1 tablet (10 mg total) by mouth daily. Taper by 10 mg daily (Patient not taking: Reported on 09/10/2017) 21 tablet 0 Completed Course at Unknown time    Current medications: Current Facility-Administered Medications  Medication Dose Route Frequency Provider Last Rate Last Dose  . acetaminophen (TYLENOL) tablet 650 mg  650 mg Oral Q6H PRN Oralia ManisWillis, David, MD   650 mg at 09/11/17 0230   Or  . acetaminophen (TYLENOL) suppository 650 mg  650 mg Rectal Q6H PRN Oralia ManisWillis, David, MD      . aspirin EC tablet 81 mg  81 mg Oral Daily Oralia ManisWillis, David, MD      . enoxaparin (LOVENOX) injection 30 mg  30 mg Subcutaneous Q24H Oralia ManisWillis, David, MD      . ondansetron [facility](ZOFRAN) tablet 4 mg  4 mg Oral Q6H PRN Oralia ManisWillis, David, MD       Or  . ondansetron  [facility](ZOFRAN) injection 4 mg  4 mg Intravenous Q6H PRN Oralia ManisWillis, David, MD      . pantoprazole (PROTONIX) EC tablet 40 mg  40 mg Oral Daily Oralia ManisWillis, David, MD          Allergies: No Known Allergies    Past Medical History: Past Medical History:  Diagnosis Date  . Heart block   . History of Mustard procedure   . Pacemaker   . Transposition great arteries      Past Surgical History: Past Surgical History:  Procedure Laterality Date  . CARDIAC SURGERY    . EXPLORATION POST OPERATIVE OPEN HEART       Family History: Family History  Problem Relation Age of Onset  . Lung cancer Father      Social History: Social History   Socioeconomic History  . Marital status: Married    Spouse name: Not on file  . Number of children: Not on file  . Years of education: Not on file  . Highest education level: Not on file  Occupational History  . Not on file  Social Needs  . Financial resource strain: Not on file  . Food insecurity:    Worry: Not on file    Inability: Not on file  . Transportation needs:    Medical: Not on file    Non-medical: Not on file  Tobacco Use  . Smoking status: Never Smoker  . Smokeless tobacco: Never Used  Substance and Sexual Activity  . Alcohol use: No  . Drug use: Not on file  . Sexual activity: Not on file  Lifestyle  . Physical activity:    Days per week: Not on file    Minutes per session: Not on file  . Stress: Not on file  Relationships  . Social connections:    Talks on phone: Not on file    Gets together: Not on file    Attends religious service: Not on file    Active member of club or organization: Not on file    Attends meetings of clubs or organizations: Not on file    Relationship status: Not on file  . Intimate partner violence:    Fear of current or ex partner: Not on file    Emotionally abused: Not on file    Physically abused: Not on file    Forced sexual activity: Not on file  Other Topics Concern  . Not on file  Social  History Narrative  . Not on file     Review of Systems: Gen: No fevers or chills HEENT: No complaints of vision or hearing problems CV: Congenital heart disease status post surgery, no chest pain at present Resp: Denies any problems with breathing, no wheezing or cough GI: Appetite has been poor.  Denies blood in the stool.  Denies diarrhea GU : Some difficulty with urination occasionally MS: Reports back pain 3 weeks Derm:  No complaints Psych: No complaints Heme: No complaints Neuro: No complaints Endocrine.  No complaints  Vital Signs: Blood pressure (!) 99/52, pulse 60, temperature 97.6 F (36.4 C), temperature source Oral, resp. rate 18, height 5\' 7"  (1.702 m), weight 84.2 kg (185 lb 9.6 oz), SpO2 98 %.   Intake/Output Summary (Last 24 hours) at 09/11/2017 0835 Last data filed at 09/11/2017 0311 Gross per 24 hour  Intake 250 ml  Output 350 ml  Net -100 ml    Weight trends: [employer]   09/10/17 2235 09/11/17 0139  Weight: 83 kg (183 lb) 84.2 kg (185 lb 9.6 oz)    Physical Exam: General:  No acute distress, laying in the bed, uremic breath  HEENT  anicteric, somewhat dry oral mucous membranes  Neck:  No JVD, no masses  Lungs:  Clear to auscultation bilaterally  Heart::  Regular rhythm  Abdomen:  Soft, mild distention  Extremities:  No peripheral edema  Neurologic:  Alert, oriented  Skin:  Dry, decreased turgor             Lab results: Basic Metabolic Panel: Recent Labs  Lab 09/10/17 2223 09/11/17 0152  NA 136 137  K 5.6* 5.6*  CL 111 113*  CO2 12* 13*  GLUCOSE 120* 95  BUN 98* 94*  CREATININE 5.17* 4.54*  CALCIUM 9.6 9.3    Liver Function Tests: Recent Labs  Lab 09/10/17 2223  AST 20  ALT 20  ALKPHOS 58  BILITOT 1.3*  PROT 7.9  ALBUMIN 4.1   No results for input(s): LIPASE, AMYLASE in the last 168 hours. No results for input(s): AMMONIA in the last 168 hours.  CBC: Recent Labs  Lab 09/10/17 2223 09/11/17 0152  WBC 9.1 7.2   NEUTROABS 7.3*  --   HGB 12.0* 11.5*  HCT 34.2* 32.7*  MCV 90.9 91.1  PLT 179 136*    Cardiac Enzymes: Recent Labs  Lab 09/11/17 0723  TROPONINI 0.03*    BNP: Invalid input(s): POCBNP  CBG: No results for input(s): GLUCAP in the last 168 hours.  Microbiology: No results found for this or any previous visit (from the past 720 hour(s)).   Coagulation Studies: No results for input(s): LABPROT, INR in the last 72 hours.  Urinalysis: No results for input(s): COLORURINE, LABSPEC, PHURINE, GLUCOSEU, HGBUR, BILIRUBINUR, KETONESUR, PROTEINUR, UROBILINOGEN, NITRITE, LEUKOCYTESUR in the last 72 hours.  Invalid input(s): APPERANCEUR      Imaging: Dg Chest Portable 1 View  Result Date: 09/10/2017 CLINICAL DATA:  Persistent neck pain and shoulder pain for 1 week. Intermittent chest pain for 3 days. Shortness of breath and dizziness while walking. EXAM: PORTABLE CHEST 1 VIEW COMPARISON:  12/14/2015 FINDINGS: Cardiac pacemaker. Cardiac enlargement. Pulmonary vascularity is normal. No airspace disease or consolidation in the lungs. No blunting of costophrenic angles. No pneumothorax. Mediastinal contours appear intact. Old right rib deformities. No change since previous study. IMPRESSION: Cardiac enlargement.  No evidence of active pulmonary disease. Electronically Signed   By: Burman Nieves M.D.   On: 09/10/2017 22:42  Assessment & Plan: Pt is a 47 y.o. 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  1.  Acute kidney injury is likely secondary to ATN and volume depletion in  the setting of taking diuretic furosemide, ibuprofen, lisinopril at home. Serum creatinine has improved slightly with IV hydration Renal ultrasound is negative for obstruction Continue IV hydration and continue to monitor renal function closely.  No acute indication for dialysis at present  2.  Hyperkalemia Likely from acute kidney injury and from spironolactone Agree with holding spironolactone  3.  BPH Start Flomax      LOS: 0 Lionel Woodberry Thedore Mins 7/3/20198:35 AM  [facility] [place], [place] [phone]  Note: This note was prepared with Dragon dictation. Any transcription errors are unintentional

## 2017-09-11 NOTE — ED Notes (Addendum)
Patient transported to 255 

## 2017-09-11 NOTE — H&P (Signed)
[facility] - [place] at [facility]   PATIENT NAME: Timothy Oconnell    MR#:  [id number]  DATE OF BIRTH:  1970-11-28  DATE OF ADMISSION:  09/10/2017  PRIMARY CARE PHYSICIAN: Patient, No Pcp Per   REQUESTING/REFERRING PHYSICIAN: Darnelle Catalan, MD  CHIEF COMPLAINT:   Chief Complaint  Patient presents with  . Chest Pain    HISTORY OF PRESENT ILLNESS:  Timothy Oconnell  is a 47 y.o. male who presents with chest pain, progressive fatigue.  Patient states that he has had to walk in the heat to get to work for the past several days.  He states that he noticed some decrease in urine output, but thought that this was normal since his normal pattern on Lasix his to make a lot of urine sometimes, and other times not so much.  He was found here tonight to be in significant renal failure.  He has not typically had chest pain in the past, and he has become progressively fatigued and at times lethargic over these past few days in conjunction with his intermittent chest pain.  He called EMS to be brought to the ED today.  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  His pediatric cardiology team is at [facility], and they were called for transfer tonight but there were no beds available.  He is on a waiting list to be transferred there, and hospitalist were called for admission here in the meantime.  PAST MEDICAL HISTORY:   Past Medical History:  Diagnosis Date  . Heart block   . History of Mustard procedure   . Pacemaker   . Transposition great arteries      PAST SURGICAL HISTORY:   Past Surgical History:  Procedure Laterality Date  . CARDIAC SURGERY    . EXPLORATION POST OPERATIVE OPEN HEART       SOCIAL HISTORY:   Social History   Tobacco Use  . Smoking status: Never Smoker  . Smokeless tobacco: Never Used   Substance Use Topics  . Alcohol use: No     FAMILY HISTORY:   Family History  Problem Relation Age of Onset  . Lung cancer Father      DRUG ALLERGIES:  No Known Allergies  MEDICATIONS AT HOME:   Prior to Admission medications   Medication Sig Start Date End Date Taking? Authorizing Provider  albuterol (PROVENTIL HFA;VENTOLIN HFA) 108 (90 Base) MCG/ACT inhaler Inhale 2 puffs into the lungs every 6 (six) hours as needed for wheezing or shortness of breath. 12/16/15  Yes Katha Hamming, MD  aspirin EC 81 MG tablet Take 81 mg by mouth daily.   Yes [provider]  calcium carbonate (TUMS - DOSED IN MG ELEMENTAL CALCIUM) 500 MG chewable tablet Chew 1 tablet by mouth 2 (two) times daily as needed for indigestion or heartburn.   Yes [provider]  carvedilol (COREG) 6.25 MG tablet Take 6.25-12.5 mg by mouth 2 (two) times daily. 6.25mg  in the morning and 12.5mg  at bedtime 08/28/17  Yes [provider]  furosemide (LASIX) 40 MG tablet Take 40 mg by mouth daily. 08/07/17  Yes [provider]  ibuprofen (ADVIL,MOTRIN) 200 MG tablet Take 200-400 mg by mouth every 6 (six) hours as needed for mild pain.    Yes [provider]  lisinopril (PRINIVIL,ZESTRIL) 20 MG tablet Take 20  mg by mouth daily. 08/07/17  Yes [provider]  Multiple Vitamin (MULTIVITAMIN WITH MINERALS) TABS tablet Take 1 tablet by mouth daily.   Yes [provider]  omeprazole (PRILOSEC OTC) 20 MG tablet Take 20 mg by mouth daily.   Yes [provider]  spironolactone (ALDACTONE) 25 MG tablet Take 25 mg by mouth daily. 08/07/17  Yes [provider]  azithromycin (ZITHROMAX Z-PAK) 250 MG tablet 500 mg po on day  One followed by 250 mg po daily for 4 days Patient not taking: Reported on 09/10/2017 12/16/15   Katha Hamming, MD  carvedilol (COREG) 3.125 MG tablet Take 1 tablet (3.125 mg total) by mouth 2 (two) times daily. 12/16/15 12/15/16   Katha Hamming, MD  predniSONE (STERAPRED UNI-PAK 21 TAB) 10 MG (21) TBPK tablet Take 1 tablet (10 mg total) by mouth daily. Taper by 10 mg daily Patient not taking: Reported on 09/10/2017 12/16/15   Katha Hamming, MD    REVIEW OF SYSTEMS:  Review of Systems  Constitutional: Negative for chills, fever, malaise/fatigue and weight loss.  HENT: Negative for ear pain, hearing loss and tinnitus.   Eyes: Negative for blurred vision, double vision, pain and redness.  Respiratory: Negative for cough, hemoptysis and shortness of breath.   Cardiovascular: Positive for chest pain. Negative for palpitations, orthopnea and leg swelling.  Gastrointestinal: Negative for abdominal pain, constipation, diarrhea, nausea and vomiting.  Genitourinary: Negative for dysuria, frequency and hematuria.       Oliguria  Musculoskeletal: Negative for back pain, joint pain and neck pain.  Skin:       No acne, rash, or lesions  Neurological: Negative for dizziness, tremors, focal weakness and weakness.  Endo/Heme/Allergies: Negative for polydipsia. Does not bruise/bleed easily.  Psychiatric/Behavioral: Negative for depression. The patient is not nervous/anxious and does not have insomnia.      VITAL SIGNS:   Vitals:   09/10/17 2345 09/11/17 0000 09/11/17 0015 09/11/17 0040  BP: 96/69 90/61 (!) 90/46 (!) 104/55  Pulse: 63  (!) 56 61  Resp: 17 15 19 14   Temp:      TempSrc:      SpO2: 97%  96% 97%  Weight:      Height:       Wt Readings from Last 3 Encounters:  09/10/17 83 kg (183 lb)  12/16/15 92.3 kg (203 lb 6.4 oz)  11/30/14 86.2 kg (190 lb)    PHYSICAL EXAMINATION:  Physical Exam  Vitals reviewed. Constitutional: He is oriented to person, place, and time. He appears well-developed and well-nourished. No distress.  HENT:  Head: Normocephalic and atraumatic.  Mouth/Throat: Oropharynx is clear and moist.  Dry mucous membranes  Eyes: Pupils are equal, round, and reactive to light.  Conjunctivae and EOM are normal. No scleral icterus.  Neck: Normal range of motion. Neck supple. No JVD present. No thyromegaly present.  Cardiovascular: Normal rate, regular rhythm and intact distal pulses. Exam reveals no gallop and no friction rub.  No murmur heard. Respiratory: Effort normal and breath sounds normal. No respiratory distress. He has no wheezes. He has no rales.  GI: Soft. Bowel sounds are normal. He exhibits no distension. There is no tenderness.  Musculoskeletal: Normal range of motion. He exhibits no edema.  No arthritis, no gout  Lymphadenopathy:    He has no cervical adenopathy.  Neurological: He is alert and oriented to person, place, and time. No cranial nerve deficit.  No dysarthria, no aphasia  Skin: Skin is warm and dry. No  rash noted. No erythema.  Psychiatric: He has a normal mood and affect. His behavior is normal. Judgment and thought content normal.    LABORATORY PANEL:   CBC Recent Labs  Lab 09/10/17 2223  WBC 9.1  HGB 12.0*  HCT 34.2*  PLT 179   ------------------------------------------------------------------------------------------------------------------  Chemistries  Recent Labs  Lab 09/10/17 2223  NA 136  K 5.6*  CL 111  CO2 12*  GLUCOSE 120*  BUN 98*  CREATININE 5.17*  CALCIUM 9.6  AST 20  ALT 20  ALKPHOS 58  BILITOT 1.3*   ------------------------------------------------------------------------------------------------------------------  Cardiac Enzymes Recent Labs  Lab 09/10/17 2223  TROPONINI 0.04*   ------------------------------------------------------------------------------------------------------------------  RADIOLOGY:  Dg Chest Portable 1 View  Result Date: 09/10/2017 CLINICAL DATA:  Persistent neck pain and shoulder pain for 1 week. Intermittent chest pain for 3 days. Shortness of breath and dizziness while walking. EXAM: PORTABLE CHEST 1 VIEW COMPARISON:  12/14/2015 FINDINGS: Cardiac pacemaker. Cardiac  enlargement. Pulmonary vascularity is normal. No airspace disease or consolidation in the lungs. No blunting of costophrenic angles. No pneumothorax. Mediastinal contours appear intact. Old right rib deformities. No change since previous study. IMPRESSION: Cardiac enlargement.  No evidence of active pulmonary disease. Electronically Signed   By: Burman NievesWilliam  Stevens M.D.   On: 09/10/2017 22:42    EKG:   Orders placed or performed during the hospital encounter of 09/10/17  . ED EKG  . ED EKG  . EKG 12-Lead  . EKG 12-Lead  . EKG 12-Lead  . EKG 12-Lead    IMPRESSION AND PLAN:  Principal Problem:   Chest pain -troponin in the ED was barely elevated at 0.04.  Patient's chest pain has resolved at this time.  We will trend his cardiac enzymes tonight, get a cardiology consult in the morning.  Patient can be transferred to [facility] to his pediatric cardiology service once a bed becomes available, he is currently on the waiting list Active Problems:   AKI (acute kidney injury) (HCC) -likely a combination between his diuretic use in conjunction with his dehydration and recent heat exposure.  His blood pressure was also on the soft side tonight.  We will administer IV fluids, avoid nephrotoxins and monitor for renal improvement.  Nephrology consult   [facility] of [facility]position [facility] as above   CHB (complete heart block) (HCC) -status post pacemaker  Chart review performed and case discussed with ED provider. Labs, imaging and/or ECG reviewed by provider and discussed with patient/family. Management plans discussed with the patient and/or family.  DVT PROPHYLAXIS: SubQ lovenox  GI PROPHYLAXIS: None  ADMISSION STATUS: Inpatient  CODE STATUS: Full Code Status History    Date Active Date Inactive Code Status Order ID Comments User Context   12/14/2015 1849 12/16/2015 1640 Full Code [id number]  Milagros LollSudini, Srikar, MD ED       TOTAL TIME TAKING CARE OF THIS PATIENT: 45 minutes.   Timothy Oconnell 09/11/2017, 12:48 AM  [facility]  Office  [phone]  CC: Primary care physician; Patient, No Pcp Per  Note:  This document was prepared using Dragon voice recognition software and may include unintentional dictation errors.

## 2017-09-11 NOTE — Consult Note (Signed)
Timothy Oconnell is a 48 y.o. male  [id number]  Primary Cardiologist: [facility] Reason for Consultation: Lightheadedness, mild troponin elevation.  HPI: 47yo male with a past medical history of transposition of the great vessels with pacemaker placed presented to ER with lightheadedness and nausea and squeezing chest pressure. Troponin was mildly elevated.   Review of Systems: No chest pain. Feels fatigued.    Past Medical History:  Diagnosis Date  . Heart block   . History of Mustard procedure   . Pacemaker   . Transposition great arteries     Medications Prior to Admission  Medication Sig Dispense Refill  . albuterol (PROVENTIL HFA;VENTOLIN HFA) 108 (90 Base) MCG/ACT inhaler Inhale 2 puffs into the lungs every 6 (six) hours as needed for wheezing or shortness of breath. 1 Inhaler 2  . aspirin EC 81 MG tablet Take 81 mg by mouth daily.    . calcium carbonate (TUMS - DOSED IN MG ELEMENTAL CALCIUM) 500 MG chewable tablet Chew 1 tablet by mouth 2 (two) times daily as needed for indigestion or heartburn.    . carvedilol (COREG) 6.25 MG tablet Take 6.25-12.5 mg by mouth 2 (two) times daily. 6.25mg  in the morning and 12.5mg  at bedtime  11  . furosemide (LASIX) 40 MG tablet Take 40 mg by mouth daily.  11  . ibuprofen (ADVIL,MOTRIN) 200 MG tablet Take 200-400 mg by mouth every 6 (six) hours as needed for mild pain.     Marland Kitchen lisinopril (PRINIVIL,ZESTRIL) 20 MG tablet Take 20 mg by mouth daily.  11  . Multiple Vitamin (MULTIVITAMIN WITH MINERALS) TABS tablet Take 1 tablet by mouth daily.    Marland Kitchen omeprazole (PRILOSEC OTC) 20 MG tablet Take 20 mg by mouth daily.    Marland Kitchen spironolactone (ALDACTONE) 25 MG tablet Take 25 mg by mouth daily.  11  . azithromycin (ZITHROMAX Z-PAK) 250 MG tablet 500 mg po on day  One followed by 250 mg po daily for 4 days (Patient not taking: Reported on 09/10/2017) 6 each 0  . carvedilol (COREG) 3.125 MG tablet Take 1 tablet (3.125 mg total) by mouth 2 (two) times daily.  60 tablet 0  . predniSONE (STERAPRED UNI-PAK 21 TAB) 10 MG (21) TBPK tablet Take 1 tablet (10 mg total) by mouth daily. Taper by 10 mg daily (Patient not taking: Reported on 09/10/2017) 21 tablet 0     . aspirin EC  81 mg Oral Daily  . enoxaparin (LOVENOX) injection  30 mg Subcutaneous Q24H  . pantoprazole  40 mg Oral Daily    Infusions:   No Known Allergies  Social History   Socioeconomic History  . Marital status: Married    Spouse name: Not on file  . Number of children: Not on file  . Years of education: Not on file  . Highest education level: Not on file  Occupational History  . Not on file  Social Needs  . Financial resource strain: Not on file  . Food insecurity:    Worry: Not on file    Inability: Not on file  . Transportation needs:    Medical: Not on file    Non-medical: Not on file  Tobacco Use  . Smoking status: Never Smoker  . Smokeless tobacco: Never Used  Substance and Sexual Activity  . Alcohol use: No  . Drug use: Not on file  . Sexual activity: Not on file  Lifestyle  . Physical activity:    Days per week: Not on file  Minutes per session: Not on file  . Stress: Not on file  Relationships  . Social connections:    Talks on phone: Not on file    Gets together: Not on file    Attends religious service: Not on file    Active member of club or organization: Not on file    Attends meetings of clubs or organizations: Not on file    Relationship status: Not on file  . Intimate partner violence:    Fear of current or ex partner: Not on file    Emotionally abused: Not on file    Physically abused: Not on file    Forced sexual activity: Not on file  Other Topics Concern  . Not on file  Social History Narrative  . Not on file    Family History  Problem Relation Age of Onset  . Lung cancer Father     PHYSICAL EXAM: Vitals:   09/11/17 0417 09/11/17 0732  BP: (!) 90/51 (!) 99/52  Pulse: 62 60  Resp: 16 18  Temp: (!) 97.4 F (36.3 C) 97.6  F (36.4 C)  SpO2: 96% 98%     Intake/Output Summary (Last 24 hours) at 09/11/2017 0847 Last data filed at 09/11/2017 0311 Gross per 24 hour  Intake 250 ml  Output 350 ml  Net -100 ml    General:  Well appearing. No respiratory difficulty HEENT: normal Neck: supple. no JVD. Carotids 2+ bilat; no bruits. No lymphadenopathy or thryomegaly appreciated. Cor: PMI nondisplaced. Regular rate & rhythm. No rubs, gallops or murmurs. Lungs: clear Abdomen: soft, nontender, nondistended. No hepatosplenomegaly. No bruits or masses. Good bowel sounds. Extremities: no cyanosis, clubbing, rash, edema Neuro: alert & oriented x 3, cranial nerves grossly intact. moves all 4 extremities w/o difficulty. Affect pleasant.  ECG: A-V dual paced rhythm 67bpm  Results for orders placed or performed during the hospital encounter of 09/10/17 (from the past 24 hour(s))  Comprehensive metabolic panel     Status: Abnormal   Collection Time: 09/10/17 10:23 PM  Result Value Ref Range   Sodium 136 135 - 145 mmol/L   Potassium 5.6 (H) 3.5 - 5.1 mmol/L   Chloride 111 98 - 111 mmol/L   CO2 12 (L) 22 - 32 mmol/L   Glucose, Bld 120 (H) 70 - 99 mg/dL   BUN 98 (H) 6 - 20 mg/dL   Creatinine, Ser 1.615.17 (H) 0.61 - 1.24 mg/dL   Calcium 9.6 8.9 - 09.610.3 mg/dL   Total Protein 7.9 6.5 - 8.1 g/dL   Albumin 4.1 3.5 - 5.0 g/dL   AST 20 15 - 41 U/L   ALT 20 0 - 44 U/L   Alkaline Phosphatase 58 38 - 126 U/L   Total Bilirubin 1.3 (H) 0.3 - 1.2 mg/dL   GFR calc non Af Amer 12 (L) >60 mL/min   GFR calc Af Amer 14 (L) >60 mL/min   Anion gap 13 5 - 15  Brain natriuretic peptide     Status: None   Collection Time: 09/10/17 10:23 PM  Result Value Ref Range   B Natriuretic Peptide 46.0 0.0 - 100.0 pg/mL  Troponin I     Status: Abnormal   Collection Time: 09/10/17 10:23 PM  Result Value Ref Range   Troponin I 0.04 (HH) <0.03 ng/mL  CBC with Differential     Status: Abnormal   Collection Time: 09/10/17 10:23 PM  Result Value Ref  Range   WBC 9.1 3.8 - 10.6 K/uL   RBC  3.76 (L) 4.40 - 5.90 MIL/uL   Hemoglobin 12.0 (L) 13.0 - 18.0 g/dL   HCT 09.8 (L) 11.9 - 14.7 %   MCV 90.9 80.0 - 100.0 fL   MCH 32.0 26.0 - 34.0 pg   MCHC 35.2 32.0 - 36.0 g/dL   RDW 82.9 (H) 56.2 - 13.0 %   Platelets 179 150 - 440 K/uL   Neutrophils Relative % 80 %   Neutro Abs 7.3 (H) 1.4 - 6.5 K/uL   Lymphocytes Relative 10 %   Lymphs Abs 0.9 (L) 1.0 - 3.6 K/uL   Monocytes Relative 8 %   Monocytes Absolute 0.7 0.2 - 1.0 K/uL   Eosinophils Relative 1 %   Eosinophils Absolute 0.1 0 - 0.7 K/uL   Basophils Relative 1 %   Basophils Absolute 0.1 0 - 0.1 K/uL  Troponin I     Status: Abnormal   Collection Time: 09/11/17  1:52 AM  Result Value Ref Range   Troponin I 0.03 (HH) <0.03 ng/mL  Basic metabolic panel     Status: Abnormal   Collection Time: 09/11/17  1:52 AM  Result Value Ref Range   Sodium 137 135 - 145 mmol/L   Potassium 5.6 (H) 3.5 - 5.1 mmol/L   Chloride 113 (H) 98 - 111 mmol/L   CO2 13 (L) 22 - 32 mmol/L   Glucose, Bld 95 70 - 99 mg/dL   BUN 94 (H) 6 - 20 mg/dL   Creatinine, Ser 8.65 (H) 0.61 - 1.24 mg/dL   Calcium 9.3 8.9 - 78.4 mg/dL   GFR calc non Af Amer 14 (L) >60 mL/min   GFR calc Af Amer 16 (L) >60 mL/min   Anion gap 11 5 - 15  CBC     Status: Abnormal   Collection Time: 09/11/17  1:52 AM  Result Value Ref Range   WBC 7.2 3.8 - 10.6 K/uL   RBC 3.59 (L) 4.40 - 5.90 MIL/uL   Hemoglobin 11.5 (L) 13.0 - 18.0 g/dL   HCT 69.6 (L) 29.5 - 28.4 %   MCV 91.1 80.0 - 100.0 fL   MCH 32.0 26.0 - 34.0 pg   MCHC 35.1 32.0 - 36.0 g/dL   RDW 13.2 (H) 44.0 - 10.2 %   Platelets 136 (L) 150 - 440 K/uL  Troponin I     Status: Abnormal   Collection Time: 09/11/17  7:23 AM  Result Value Ref Range   Troponin I 0.03 (HH) <0.03 ng/mL   Dg Chest Portable 1 View  Result Date: 09/10/2017 CLINICAL DATA:  Persistent neck pain and shoulder pain for 1 week. Intermittent chest pain for 3 days. Shortness of breath and dizziness while walking.  EXAM: PORTABLE CHEST 1 VIEW COMPARISON:  12/14/2015 FINDINGS: Cardiac pacemaker. Cardiac enlargement. Pulmonary vascularity is normal. No airspace disease or consolidation in the lungs. No blunting of costophrenic angles. No pneumothorax. Mediastinal contours appear intact. Old right rib deformities. No change since previous study. IMPRESSION: Cardiac enlargement.  No evidence of active pulmonary disease. Electronically Signed   By: Burman Nieves M.D.   On: 09/10/2017 22:42     ASSESSMENT AND PLAN:  Chest pain: Chest pain has resolved, troponin is down. Echo pending.  Borderline low blood pressure: Continue to hold lisinopril and aldactone. Nephrology following for management of acute kidney injury.  Will continue to follow.   Caroleen Hamman, NP-C Cell: [phone]

## 2017-09-11 NOTE — Progress Notes (Signed)
[employer] - [place] at [facility]   PATIENT NAME: Timothy Oconnell    MR#:  [id number]  DATE OF BIRTH:  [birth year]  SUBJECTIVE:   Patient presented to the hospital with some chest pain.  Also noted to be in acute renal failure.  He denies any chest pain this morning.  He admits to some neck ache and backache.  Renal function is improving with IV fluids.  REVIEW OF SYSTEMS:    Review of Systems  Constitutional: Negative for chills and fever.  HENT: Negative for congestion and tinnitus.   Eyes: Negative for blurred vision and double vision.  Respiratory: Negative for cough, shortness of breath and wheezing.   Cardiovascular: Negative for chest pain, orthopnea and PND.  Gastrointestinal: Negative for abdominal pain, diarrhea, nausea and vomiting.  Genitourinary: Negative for dysuria and hematuria.  Musculoskeletal: Positive for back pain and neck pain.  Neurological: Negative for dizziness, sensory change and focal weakness.  All other systems reviewed and are negative.   Nutrition: Heart Healthy Tolerating Diet: Yes Tolerating PT: Ambulatory   DRUG ALLERGIES:  No Known Allergies  VITALS:  Blood pressure (!) 99/52, pulse 60, temperature 97.6 F (36.4 C), temperature source Oral, resp. rate 18, height 5\' 7"  (1.702 m), weight 84.2 kg (185 lb 9.6 oz), SpO2 98 %.  PHYSICAL EXAMINATION:   Physical Exam  GENERAL:  47 y.o.-year-old patient lying in bed in no acute distress.  EYES: Pupils equal, round, reactive to light and accommodation. No scleral icterus. Extraocular muscles intact.  HEENT: Head atraumatic, normocephalic. Oropharynx and nasopharynx clear.  NECK:  Supple, no jugular venous distention. No thyroid enlargement, no tenderness.  LUNGS: Normal breath sounds bilaterally, no wheezing, rales, rhonchi. No use of accessory muscles of respiration.  CARDIOVASCULAR: S1, S2 normal. II/VI SEM at RSB, No rubs, or gallops.  ABDOMEN: Soft, nontender, nondistended.  Bowel sounds present. No organomegaly or mass.  EXTREMITIES: No cyanosis, clubbing or edema b/l.    NEUROLOGIC: Cranial nerves II through XII are intact. No focal Motor or sensory deficits b/l.   PSYCHIATRIC: The patient is alert and oriented x 3.  SKIN: No obvious rash, lesion, or ulcer.    LABORATORY PANEL:   CBC Recent Labs  Lab 09/11/17 0152  WBC 7.2  HGB 11.5*  HCT 32.7*  PLT 136*   ------------------------------------------------------------------------------------------------------------------  Chemistries  Recent Labs  Lab 09/10/17 2223 09/11/17 0152  NA 136 137  K 5.6* 5.6*  CL 111 113*  CO2 12* 13*  GLUCOSE 120* 95  BUN 98* 94*  CREATININE 5.17* 4.54*  CALCIUM 9.6 9.3  AST 20  --   ALT 20  --   ALKPHOS 58  --   BILITOT 1.3*  --    ------------------------------------------------------------------------------------------------------------------  Cardiac Enzymes Recent Labs  Lab 09/11/17 0723  TROPONINI 0.03*   ------------------------------------------------------------------------------------------------------------------  RADIOLOGY:  US Renal  Result Date: 09/11/2017 CLINICAL DATA:  Acute renal failure EXAM: RENAL / URINARY TRACT ULTRASOUND COMPLETE COMPARISON:  None. FINDINGS: Right Kidney: Length: 11 cm. Echogenicity within normal limits. No mass or hydronephrosis visualized. Left Kidney: Length: 12 cm. Echogenicity within normal limits. No mass or hydronephrosis visualized. Bladder: Appears normal for degree of bladder distention. Enlarged prostate measuring up to 5.5 cm Enlarged spleen measuring up to 15 cm in length. There is a total volume of 577 cc. Spleen was partially covered on a 2017 chest CT, limiting comparison. There was mediastinal adenopathy on that scan and possible cirrhosis. IMPRESSION: 1. Negative renal and bladder ultrasound. 2.  Enlarged prostate. 3. Incidental splenomegaly. Electronically Signed   By: Marnee SpringJonathon  Watts M.D.   On:  09/11/2017 09:28   Dg Chest Portable 1 View  Result Date: 09/10/2017 CLINICAL DATA:  Persistent neck pain and shoulder pain for 1 week. Intermittent chest pain for 3 days. Shortness of breath and dizziness while walking. EXAM: PORTABLE CHEST 1 VIEW COMPARISON:  12/14/2015 FINDINGS: Cardiac pacemaker. Cardiac enlargement. Pulmonary vascularity is normal. No airspace disease or consolidation in the lungs. No blunting of costophrenic angles. No pneumothorax. Mediastinal contours appear intact. Old right rib deformities. No change since previous study. IMPRESSION: Cardiac enlargement.  No evidence of active pulmonary disease. Electronically Signed   By: Burman NievesWilliam  Stevens M.D.   On: 09/10/2017 22:42     ASSESSMENT AND PLAN:   47 year old male with past medical history of transposition of great vessels, heart block status post pacemaker, history of chronic diastolic CHF, Essential hypertension, GERD who presented to the hospital due to chest pain but also noted to be in acute renal failure.  1.  Acute renal failure-this is likely prerenal azotemia with dehydration.  Patient is on diuretics, had poor p.o. intake over the past week. - Continue gentle hydration with IV fluids, follow BUN and creatinine urine output.  Renal ultrasound negative for obstruction. - Continue to hold Lasix, Aldactone, lisinopril.  2.  Chest pain-patient's symptoms were worrisome for angina. -His cardiac markers although have remained negative. -Patient has a history of transposition of great vessels status post surgery.  Patient is on a [facility] as [facility] follows [facility] there.  No acute cardiac symptoms presently.  Continue supportive care. -Continue aspirin.  3.  GERD-continue Protonix.  4.  BPH-continue Flomax.     All the records are reviewed and case discussed with Care Management/Social Worker. Management plans discussed with the patient, family and they are in agreement.  CODE STATUS: Full  code  DVT Prophylaxis: Hep SQ  TOTAL TIME TAKING CARE OF THIS PATIENT: 30 minutes.   POSSIBLE D/C IN 1-2 DAYS, DEPENDING ON CLINICAL CONDITION.   Houston SirenSAINANI,Rajohn Henery J M.D on 09/11/2017 at 1:46 PM  Between 7am to 6pm - Pager - [phone]  After 6pm go to [email or web address] - Social research officer, [email or web address] [email or web address]  [facility] [place] [facility]  Office  [phone]  CC: Primary care physician; Patient, No Pcp Per

## 2017-09-11 NOTE — Clinical Social Work Note (Addendum)
CSW received consult that patient needs assistance with medications, case manager can assist with this, CSW notified case manager.  CSW to sign off please reconsult if social work needs arise.  Ervin KnackEric R. Kazoua Gossen, MSW, Theresia MajorsLCSWA [phone]  09/11/2017 9:32 AM

## 2017-09-12 LAB — BASIC METABOLIC PANEL
Anion gap: 4 — ABNORMAL LOW (ref 5–15)
BUN: 79 mg/dL — AB (ref 6–20)
CHLORIDE: 119 mmol/L — AB (ref 98–111)
CO2: 17 mmol/L — ABNORMAL LOW (ref 22–32)
Calcium: 8.6 mg/dL — ABNORMAL LOW (ref 8.9–10.3)
Creatinine, Ser: 2.39 mg/dL — ABNORMAL HIGH (ref 0.61–1.24)
GFR, EST AFRICAN AMERICAN: 35 mL/min — AB (ref >60)
GFR, EST NON AFRICAN AMERICAN: 30 mL/min — AB (ref >60)
GLUCOSE: 112 mg/dL — AB (ref 70–99)
POTASSIUM: 5.5 mmol/L — AB (ref 3.5–5.1)
SODIUM: 140 mmol/L (ref 135–145)

## 2017-09-12 LAB — HIV ANTIBODY (ROUTINE TESTING W REFLEX): HIV Screen 4th Generation wRfx: NONREACTIVE

## 2017-09-12 MED ORDER — TAMSULOSIN HCL 0.4 MG PO CAPS: 0.4000 mg | ORAL_CAPSULE | Freq: Every day | ORAL | 0 refills | Status: DC

## 2017-09-12 NOTE — Discharge Summary (Signed)
[employer] - [place] at [facility]   PATIENT NAME: Timothy Oconnell    MR#:  [id number]  DATE OF BIRTH:  01/29/1971  DATE OF ADMISSION:  09/10/2017 ADMITTING PHYSICIAN: Oralia Manisavid Willis, MD  DATE OF DISCHARGE: 09/12/2017 11:30 AM  PRIMARY CARE PHYSICIAN: Dr Delrae Sawyersichard Krasuski   ADMISSION DIAGNOSIS:  Unstable angina pectoris (HCC) [I20.0] AKI (acute kidney injury) (HCC) [N17.9] Hypotension, unspecified hypotension type [I95.9]  DISCHARGE DIAGNOSIS:  Principal Problem:   Chest pain Active Problems:   History of transposition of great arteries   CHB (complete heart block) (HCC)   AKI (acute kidney injury) (HCC)   SECONDARY DIAGNOSIS:   Past Medical History:  Diagnosis Date  . Heart block   . History of Mustard procedure   . Pacemaker   . Transposition great arteries     HOSPITAL COURSE:   1.  Acute kidney injury.  Likely prerenal azotemia with dehydration.  Patient improved with his creatinine with IV fluids.  Case discussed with Dr. Thedore MinsSingh nephrology and he stated that the patient can go home at this time.  Hold, lasix, aldactone and lisinopril.  Spoke with [facility] at [phone]. 2. Chest pain reolved 3. gerd on ppi 4. bph flomax  DISCHARGE CONDITIONS:   satisfactory  CONSULTS OBTAINED:  Treatment Team:  Laurier NancyKhan, Shaukat A, MD Mosetta PigeonSingh, Harmeet, MD  DRUG ALLERGIES:  No Known Allergies  DISCHARGE MEDICATIONS:   Allergies as of 09/12/2017   No Known Allergies     Medication List    STOP taking these medications   azithromycin 250 MG tablet Commonly known as:  ZITHROMAX Z-PAK   furosemide 40 MG tablet Commonly known as:  LASIX   ibuprofen 200 MG tablet Commonly known as:  ADVIL,MOTRIN   lisinopril 20 MG tablet Commonly known as:  PRINIVIL,ZESTRIL   predniSONE 10 MG (21) Tbpk tablet Commonly known as:  STERAPRED UNI-PAK 21 TAB   spironolactone 25 MG tablet Commonly known as:  ALDACTONE     TAKE these medications   albuterol 108 (90  Base) MCG/ACT inhaler Commonly known as:  PROVENTIL HFA;VENTOLIN HFA Inhale 2 puffs into the lungs every 6 (six) hours as needed for wheezing or shortness of breath.   aspirin EC 81 MG tablet Take 81 mg by mouth daily.   calcium carbonate 500 MG chewable tablet Commonly known as:  TUMS - dosed in mg elemental calcium Chew 1 tablet by mouth 2 (two) times daily as needed for indigestion or heartburn.   carvedilol 6.25 MG tablet Commonly known as:  COREG Take 6.25-12.5 mg by mouth 2 (two) times daily. Patient takes 6.25 mg by mouth in the morning and 12.5 mg by mouth at bedtime.   multivitamin with minerals Tabs tablet Take 1 tablet by mouth daily.   omeprazole 20 MG tablet Commonly known as:  PRILOSEC OTC Take 20 mg by mouth daily.   tamsulosin 0.4 MG Caps capsule Commonly known as:  FLOMAX Take 1 capsule (0.4 mg total) by mouth daily after supper.        DISCHARGE INSTRUCTIONS:   Follow up [facility] as [facility] as possible [facility] if [facility]  If you experience worsening of your admission symptoms, develop shortness of breath, life threatening emergency, suicidal or homicidal thoughts you must seek medical attention immediately by calling 911 or calling your MD immediately  if symptoms less severe.  You Must read complete instructions/literature along with all the possible adverse reactions/side effects for all the Medicines you take and  that have been prescribed to you. Take any new Medicines after you have completely understood and accept all the possible adverse reactions/side effects.   Please note  You were cared for by a hospitalist during your hospital stay. If you have any questions about your discharge medications or the care you received while you were in the hospital after you are discharged, you can call the unit and asked to speak with the hospitalist on call if the hospitalist that took care of you is not available. Once you are discharged,  your primary care physician will handle any further medical issues. Please note that NO REFILLS for any discharge medications will be authorized once you are discharged, as it is imperative that you return to your primary care physician (or establish a relationship with a primary care physician if you do not have one) for your aftercare needs so that they can reassess your need for medications and monitor your lab values.    Today   CHIEF COMPLAINT:   Chief Complaint  Patient presents with  . Chest Pain    HISTORY OF PRESENT ILLNESS:  Timothy Oconnell  is a 47 y.o. male presented with chest pain   VITAL SIGNS:  Blood pressure 107/64, pulse 62, temperature (!) 97.5 F (36.4 C), temperature source Oral, resp. rate 18, height 5\' 7"  (1.702 m), weight 84.2 kg (185 lb 9.6 oz), SpO2 99 %.    PHYSICAL EXAMINATION:  GENERAL:  47 y.o.-year-old patient lying in the bed with no acute distress.  EYES: Pupils equal, round, reactive to light and accommodation. No scleral icterus. Extraocular muscles intact.  HEENT: Head atraumatic, normocephalic. Oropharynx and nasopharynx clear.  NECK:  Supple, no jugular venous distention. No thyroid enlargement, no tenderness.  LUNGS: Normal breath sounds bilaterally, no wheezing, rales,rhonchi or crepitation. No use of accessory muscles of respiration.  CARDIOVASCULAR: S1, S2 normal. No murmurs, rubs, or gallops.  ABDOMEN: Soft, non-tender, non-distended. Bowel sounds present. No organomegaly or mass.  EXTREMITIES: 2+ pedal edema, No cyanosis, or clubbing.  NEUROLOGIC: Cranial nerves II through XII are intact. Muscle strength 5/5 in all extremities. Sensation intact. Gait not checked.  PSYCHIATRIC: The patient is alert and oriented x 3.  SKIN: No obvious rash, lesion, or ulcer.   DATA REVIEW:   CBC Recent Labs  Lab 09/11/17 0152  WBC 7.2  HGB 11.5*  HCT 32.7*  PLT 136*    Chemistries  Recent Labs  Lab 09/10/17 2223  09/12/17 0300  NA 136   < >  140  K 5.6*   < > 5.5*  CL 111   < > 119*  CO2 12*   < > 17*  GLUCOSE 120*   < > 112*  BUN 98*   < > 79*  CREATININE 5.17*   < > 2.39*  CALCIUM 9.6   < > 8.6*  AST 20  --   --   ALT 20  --   --   ALKPHOS 58  --   --   BILITOT 1.3*  --   --    < > = values in this interval not displayed.    Cardiac Enzymes Recent Labs  Lab 09/11/17 1348  TROPONINI <0.03    Microbiology Results  Results for orders placed or performed during the hospital encounter of 12/14/15  Culture, blood (Routine x 2)     Status: None   Collection Time: 12/14/15  4:50 PM  Result Value Ref Range Status   Specimen Description BLOOD  R HAND   Final   Special Requests   Final    BOTTLES DRAWN AEROBIC AND ANAEROBIC  AER 5 ML ANA .5 ML   Culture NO GROWTH 5 DAYS  Final   Report Status 03/02/2016 FINAL  Final  Culture, blood (Routine x 2)     Status: None   Collection Time: 12/14/15  4:52 PM  Result Value Ref Range Status   Specimen Description BLOOD  L ARM  Final   Special Requests   Final    BOTTLES DRAWN AEROBIC AND ANAEROBIC  AER 10 ML ANA 8 ML    Culture NO GROWTH 5 DAYS  Final   Report Status 03/02/2016 FINAL  Final  Blood culture (single)     Status: None   Collection Time: 12/14/15  5:00 PM  Result Value Ref Range Status   Specimen Description BLOOD  R ARM  Final   Special Requests   Final    BOTTLES DRAWN AEROBIC AND ANAEROBIC  AER 7 ML ANA .5 ML   Culture NO GROWTH 5 DAYS  Final   Report Status 03/02/2016 FINAL  Final    RADIOLOGY:  US Renal  Result Date: 09/11/2017 CLINICAL DATA:  Acute renal failure EXAM: RENAL / URINARY TRACT ULTRASOUND COMPLETE COMPARISON:  None. FINDINGS: Right Kidney: Length: 11 cm. Echogenicity within normal limits. No mass or hydronephrosis visualized. Left Kidney: Length: 12 cm. Echogenicity within normal limits. No mass or hydronephrosis visualized. Bladder: Appears normal for degree of bladder distention. Enlarged prostate measuring up to 5.5 cm Enlarged spleen  measuring up to 15 cm in length. There is a total volume of 577 cc. Spleen was partially covered on a 2017 chest CT, limiting comparison. There was mediastinal adenopathy on that scan and possible cirrhosis. IMPRESSION: 1. Negative renal and bladder ultrasound. 2. Enlarged prostate. 3. Incidental splenomegaly. Electronically Signed   By: Marnee Spring M.D.   On: 09/11/2017 09:28   Dg Chest Portable 1 View  Result Date: 09/10/2017 CLINICAL DATA:  Persistent neck pain and shoulder pain for 1 week. Intermittent chest pain for 3 days. Shortness of breath and dizziness while walking. EXAM: PORTABLE CHEST 1 VIEW COMPARISON:  12/14/2015 FINDINGS: Cardiac pacemaker. Cardiac enlargement. Pulmonary vascularity is normal. No airspace disease or consolidation in the lungs. No blunting of costophrenic angles. No pneumothorax. Mediastinal contours appear intact. Old right rib deformities. No change since previous study. IMPRESSION: Cardiac enlargement.  No evidence of active pulmonary disease. Electronically Signed   By: Burman Nieves M.D.   On: 09/10/2017 22:42       Management plans discussed with the patient, and he is in agreement.  CODE STATUS:     Code Status Orders  (From admission, onward)        Start     Ordered   09/11/17 0130  Full code  Continuous     09/11/17 0129    Code Status History    Date Active Date Inactive Code Status Order ID Comments User Context   12/14/2015 1849 12/16/2015 1640 Full Code [id number]  Milagros Loll, MD ED      TOTAL TIME TAKING CARE OF THIS PATIENT: 35 minutes.    Alford Highland M.D on 09/12/2017 at 1:37 PM  Between 7am to 6pm - Pager - [phone]  After 6pm go to [email or web address] - password [email or web address] [facility]  [employer] Office  [phone]  CC: Primary care physician; Patient, No Pcp Per

## 2017-09-12 NOTE — Discharge Instructions (Signed)
If 3 pound weight gain in one day or 5 pounds in a week can restart lasix

## 2017-09-12 NOTE — Progress Notes (Signed)
[facility], [place] 09/12/17  Subjective:   Doing better S Creatinine improved to 2.4 States his appetite has improved  Objective:  Vital signs in last 24 hours:  Temp:  [97.5 F (36.4 C)-98.3 F (36.8 C)] 97.5 F (36.4 C) (07/04 0754) Pulse Rate:  [46-64] 62 (07/04 0754) Resp:  [18] 18 (07/04 0754) BP: (94-107)/(50-64) 107/64 (07/04 0754) SpO2:  [98 %-99 %] 99 % (07/04 0754)  Weight change:  Filed Weights   09/10/17 2235 09/11/17 0139  Weight: 83 kg (183 lb) 84.2 kg (185 lb 9.6 oz)    Intake/Output:    Intake/Output Summary (Last 24 hours) at 09/12/2017 1610 Last data filed at 09/12/2017 0600 Gross per 24 hour  Intake 2602.5 ml  Output 1200 ml  Net 1402.5 ml    Physical Exam: General:  No acute distress, laying in the bed,    HEENT  anicteric, moist oral mucous membranes  Neck:  No JVD, no masses  Lungs:  Clear to auscultation bilaterally  Heart::  Regular rhythm  Abdomen:  Soft, mild distention  Extremities:  No peripheral edema, varicose veins  Neurologic:  Alert, oriented  Skin:  Dry, decreased turgor     Basic Metabolic Panel:  Recent Labs  Lab 09/10/17 2223 09/11/17 0152 09/12/17 0300  NA 136 137 140  K 5.6* 5.6* 5.5*  CL 111 113* 119*  CO2 12* 13* 17*  GLUCOSE 120* 95 112*  BUN 98* 94* 79*  CREATININE 5.17* 4.54* 2.39*  CALCIUM 9.6 9.3 8.6*     CBC: Recent Labs  Lab 09/10/17 2223 09/11/17 0152  WBC 9.1 7.2  NEUTROABS 7.3*  --   HGB 12.0* 11.5*  HCT 34.2* 32.7*  MCV 90.9 91.1  PLT 179 136*     No results found for: HEPBSAG, HEPBSAB, HEPBIGM    Microbiology:  No results found for this or any previous visit (from the past 240 hour(s)).  Coagulation Studies: No results for input(s): LABPROT, INR in the last 72 hours.  Urinalysis: Recent Labs    09/11/17 1301  COLORURINE STRAW*  LABSPEC 1.009  PHURINE 5.0  GLUCOSEU NEGATIVE  HGBUR NEGATIVE  BILIRUBINUR NEGATIVE  KETONESUR NEGATIVE   PROTEINUR NEGATIVE  NITRITE NEGATIVE  LEUKOCYTESUR NEGATIVE      Imaging: US Renal  Result Date: 09/11/2017 CLINICAL DATA:  Acute renal failure EXAM: RENAL / URINARY TRACT ULTRASOUND COMPLETE COMPARISON:  None. FINDINGS: Right Kidney: Length: 11 cm. Echogenicity within normal limits. No mass or hydronephrosis visualized. Left Kidney: Length: 12 cm. Echogenicity within normal limits. No mass or hydronephrosis visualized. Bladder: Appears normal for degree of bladder distention. Enlarged prostate measuring up to 5.5 cm Enlarged spleen measuring up to 15 cm in length. There is a total volume of 577 cc. Spleen was partially covered on a 2017 chest CT, limiting comparison. There was mediastinal adenopathy on that scan and possible cirrhosis. IMPRESSION: 1. Negative renal and bladder ultrasound. 2. Enlarged prostate. 3. Incidental splenomegaly. Electronically Signed   By: Marnee Spring M.D.   On: 09/11/2017 09:28   Dg Chest Portable 1 View  Result Date: 09/10/2017 CLINICAL DATA:  Persistent neck pain and shoulder pain for 1 week. Intermittent chest pain for 3 days. Shortness of breath and dizziness while walking. EXAM: PORTABLE CHEST 1 VIEW COMPARISON:  12/14/2015 FINDINGS: Cardiac pacemaker. Cardiac enlargement. Pulmonary vascularity is normal. No airspace disease or consolidation in the lungs. No blunting of costophrenic angles. No pneumothorax. Mediastinal contours appear intact. Old right rib deformities. No change  since previous study. IMPRESSION: Cardiac enlargement.  No evidence of active pulmonary disease. Electronically Signed   By: Burman NievesWilliam  Stevens M.D.   On: 09/10/2017 22:42     Medications:   . sodium chloride 125 mL/hr at 09/12/17 0015   . aspirin EC  81 mg Oral Daily  . heparin injection (subcutaneous)  5,000 Units Subcutaneous Q8H  . pantoprazole  40 mg Oral Daily  . tamsulosin  0.4 mg Oral QPC supper   acetaminophen **OR** acetaminophen, ondansetron **OR** ondansetron (ZOFRAN)  IV  Assessment/ Plan:  47 y.o. 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  1.  Acute kidney injury is likely secondary to ATN and volume depletion in the setting of taking diuretic furosemide, ibuprofen, lisinopril at home. Serum creatinine has improved significantly with IV hydration Renal ultrasound is negative for obstruction OK to d/c from renal standpoint. Follow outpatient with PMD/nephrology  2.  Hyperkalemia Likely from acute kidney injury and from spironolactone Agree with holding spironolactone  3.  BPH Continue Flomax  4. Splenomegaly Outpatient work up     LOS: 1 Yzabella Crunk 7/4/20198:21 AM  [facility] [place], [place] [phone]  Note: This note was prepared with Dragon dictation. Any transcription errors are unintentional

## 2017-09-12 NOTE — Care Management (Signed)
Spoke with patient. He is uninsured with no PCP. His current medications are prescribed by [facility]. Patient is employed full time at [employer]. He reports he can not afford the insurance. He does not qualify for Medicaid.. Provided patient with a medication management and [facility] application. [facility] to [facility]. Patient denies issues paying for his medications. His only complaint is finding reasonably priced transportation back and forth to appointments.

## 2017-09-12 NOTE — Progress Notes (Signed)
SUBJECTIVE: patient denies any further chest pain or shortness of breath   Vitals:   09/11/17 1641 09/11/17 1922 09/12/17 0359 09/12/17 0754  BP: (!) 97/58 101/61 (!) 94/50 107/64  Pulse: 64 63 (!) 46 62  Resp: 18   18  Temp: 98.3 F (36.8 C) 98.1 F (36.7 C) 97.6 F (36.4 C) (!) 97.5 F (36.4 C)  TempSrc: Oral Oral Oral Oral  SpO2: 99% 98% 98% 99%  Weight:      Height:        Intake/Output Summary (Last 24 hours) at 09/12/2017 1120 Last data filed at 09/12/2017 1038 Gross per 24 hour  Intake 2962.5 ml  Output 1200 ml  Net 1762.5 ml    LABS: Basic Metabolic Panel: Recent Labs    09/11/17 0152 09/12/17 0300  NA 137 140  K 5.6* 5.5*  CL 113* 119*  CO2 13* 17*  GLUCOSE 95 112*  BUN 94* 79*  CREATININE 4.54* 2.39*  CALCIUM 9.3 8.6*   Liver Function Tests: Recent Labs    09/10/17 2223  AST 20  ALT 20  ALKPHOS 58  BILITOT 1.3*  PROT 7.9  ALBUMIN 4.1   No results for input(s): LIPASE, AMYLASE in the last 72 hours. CBC: Recent Labs    09/10/17 2223 09/11/17 0152  WBC 9.1 7.2  NEUTROABS 7.3*  --   HGB 12.0* 11.5*  HCT 34.2* 32.7*  MCV 90.9 91.1  PLT 179 136*   Cardiac Enzymes: Recent Labs    09/11/17 0152 09/11/17 0723 09/11/17 1348  TROPONINI 0.03* 0.03* <0.03   BNP: Invalid input(s): POCBNP D-Dimer: No results for input(s): DDIMER in the last 72 hours. Hemoglobin A1C: No results for input(s): HGBA1C in the last 72 hours. Fasting Lipid Panel: No results for input(s): CHOL, HDL, LDLCALC, TRIG, CHOLHDL, LDLDIRECT in the last 72 hours. Thyroid Function Tests: No results for input(s): TSH, T4TOTAL, T3FREE, THYROIDAB in the last 72 hours.  Invalid input(s): FREET3 Anemia Panel: No results for input(s): VITAMINB12, FOLATE, FERRITIN, TIBC, IRON, RETICCTPCT in the last 72 hours.   PHYSICAL EXAM General: Well developed, well nourished, in no acute distress HEENT:  Normocephalic and atramatic Neck:  No JVD.  Lungs: Clear bilaterally to  auscultation and percussion. Heart: HRRR . Normal S1 and S2 without gallops or murmurs.  Abdomen: Bowel sounds are positive, abdomen soft and non-tender  Msk:  Back normal, normal gait. Normal strength and tone for age. Extremities: No clubbing, cyanosis or edema.   Neuro: Alert and oriented X 3. Psych:  Good affect, responds appropriately  TELEMEt  Paced rhythm  ASSESSMENT AND PLAN: atypical chest pain with history of transposition of great vessels with a left ventricular systolic function being normal and severely dilated right ventricle as well as right atrium. Patient's medications were readjusted as feeling much better and [facility]  Principal Problem:   Chest pain Active Problems:   History of transposition of great arteries   CHB (complete heart block) (HCC)   AKI (acute kidney injury) (HCC)    KHAN,SHAUKAT A, MD, [facility] 09/12/2017 11:20 AM

## 2017-11-17 ENCOUNTER — Emergency Department: Payer: Medicaid Other

## 2017-11-17 ENCOUNTER — Emergency Department
Admission: EM | Admit: 2017-11-17 | Discharge: 2017-11-17 | Disposition: A | Discharge status: Home / Self Care | Payer: Medicaid Other | Attending: Emergency Medicine | Admitting: Emergency Medicine

## 2017-11-17 ENCOUNTER — Encounter: Payer: Self-pay | Admitting: Emergency Medicine

## 2017-11-17 DIAGNOSIS — M222X1 Patellofemoral disorders, right knee: Secondary | ICD-10-CM | POA: Insufficient documentation

## 2017-11-17 DIAGNOSIS — M25461 Effusion, right knee: Secondary | ICD-10-CM | POA: Insufficient documentation

## 2017-11-17 DIAGNOSIS — Z79899 Other long term (current) drug therapy: Secondary | ICD-10-CM | POA: Insufficient documentation

## 2017-11-17 MED ORDER — PREDNISONE 10 MG PO TABS: 10.0000 mg | ORAL_TABLET | Freq: Every day | ORAL | 0 refills | Status: DC

## 2017-11-17 MED ORDER — PREDNISONE 20 MG PO TABS
ORAL_TABLET | ORAL | Status: AC
  Filled 2017-11-17: qty 1

## 2017-11-17 MED ORDER — TRAMADOL HCL 50 MG PO TABS: 50.0000 mg | ORAL_TABLET | Freq: Four times a day (QID) | ORAL | 0 refills | Status: DC | PRN

## 2017-11-17 MED ORDER — PREDNISONE 20 MG PO TABS
60.0000 mg | ORAL_TABLET | Freq: Once | ORAL | Status: AC
  Administered 2017-11-17: 60 mg via ORAL
  Filled 2017-11-17: qty 3

## 2017-11-17 NOTE — ED Triage Notes (Signed)
Patient presents to the ED with right knee pain x 4 days.  Patient states pain began after some heavy lifting at work.  Patient states, "I hear grinding when I move my knee."  Patient states ibuprofen works very temporarily.

## 2017-11-17 NOTE — ED Provider Notes (Signed)
[facility] Emergency Department Provider Note  ____________________________________________  Time seen: Approximately 3:08 PM  I have reviewed the triage vital signs and the nursing notes.   HISTORY  Chief Complaint Knee Pain    HPI Timothy Oconnell is a 47 y.o. male who presents the emergency department complaining of  right knee pain.  Patient presents stating that he was performing more lifting at work, trying to use body mechanics and by bending his knee.  Patient reports that after this occurrence he developed increasing knee pain.  He reports that his knee does appear swollen at this time.  Initial extra use of his knee occurred 4 days ago.  Symptoms have been increasing since then.  Patient is tried ibuprofen which helps but typically in 1.5 to 2 hours symptoms have returned.  Patient denies any erythema, edema of other parts of his leg.  Patient does report that the pain will occasionally radiate from his knee into his hip.   Past Medical History:  Diagnosis Date  . Heart block   . History of Mustard procedure   . Pacemaker   . Transposition great arteries     Patient Active Problem List   Diagnosis Date Noted  . Chest pain 09/11/2017  . History of transposition of great arteries 09/11/2017  . CHB (complete heart block) (HCC) 09/11/2017  . AKI (acute kidney injury) (HCC) 09/11/2017  . Bronchitis 12/14/2015    Past Surgical History:  Procedure Laterality Date  . CARDIAC SURGERY    . EXPLORATION POST OPERATIVE OPEN HEART      Prior to Admission medications   Medication Sig Start Date End Date Taking? Authorizing Provider  albuterol (PROVENTIL HFA;VENTOLIN HFA) 108 (90 Base) MCG/ACT inhaler Inhale 2 puffs into the lungs every 6 (six) hours as needed for wheezing or shortness of breath. 12/16/15   Katha Hamming, MD  aspirin EC 81 MG tablet Take 81 mg by mouth daily.    [provider]  calcium carbonate (TUMS - DOSED IN MG  ELEMENTAL CALCIUM) 500 MG chewable tablet Chew 1 tablet by mouth 2 (two) times daily as needed for indigestion or heartburn.    [provider]  carvedilol (COREG) 6.25 MG tablet Take 6.25-12.5 mg by mouth 2 (two) times daily. Patient takes 6.25 mg by mouth in the morning and 12.5 mg by mouth at bedtime. 08/28/17   [provider]  Multiple Vitamin (MULTIVITAMIN WITH MINERALS) TABS tablet Take 1 tablet by mouth daily.    [provider]  omeprazole (PRILOSEC OTC) 20 MG tablet Take 20 mg by mouth daily.    [provider]  predniSONE (DELTASONE) 10 MG tablet Take 1 tablet (10 mg total) by mouth daily. 11/17/17   Jasmina Gendron, Delorise Royals, PA-C  tamsulosin (FLOMAX) 0.4 MG CAPS capsule Take 1 capsule (0.4 mg total) by mouth daily after supper. 09/12/17   Alford Highland, MD  traMADol (ULTRAM) 50 MG tablet Take 1 tablet (50 mg total) by mouth every 6 (six) hours as needed. 11/17/17   Markeita Alicia, Delorise Royals, PA-C    Allergies Patient has no known allergies.  Family History  Problem Relation Age of Onset  . Lung cancer Father     Social History Social History   Tobacco Use  . Smoking status: Never Smoker  . Smokeless tobacco: Never Used  Substance Use Topics  . Alcohol use: No  . Drug use: Not on file     Review of Systems  Constitutional: No fever/chills Eyes: No visual  changes.  Cardiovascular: no chest pain. Respiratory: no cough. No SOB. Gastrointestinal: No abdominal pain.  No nausea, no vomiting.  Musculoskeletal: Positive for right knee pain and swelling Skin: Negative for rash, abrasions, lacerations, ecchymosis. Neurological: Negative for headaches, focal weakness or numbness. 10-point ROS otherwise negative.  ____________________________________________   PHYSICAL EXAM:  VITAL SIGNS: ED Triage Vitals  Enc Vitals Group     BP 11/17/17 1435 118/60     Pulse Rate 11/17/17 1435 75     Resp 11/17/17 1435 18     Temp 11/17/17 1435 99.5 F  (37.5 C)     Temp Source 11/17/17 1435 Oral     SpO2 11/17/17 1435 96 %     Weight 11/17/17 1436 190 lb (86.2 kg)     Height 11/17/17 1436 5\' 7"  (1.702 m)     Head Circumference --      Peak Flow --      Pain Score 11/17/17 1435 7     Pain Loc --      Pain Edu? --      Excl. in GC? --      Constitutional: Alert and oriented. Well appearing and in no acute distress. Eyes: Conjunctivae are normal. PERRL. EOMI. Head: Atraumatic. Neck: No stridor.    Cardiovascular: Normal rate, regular rhythm. Normal S1 and S2.  Good peripheral circulation. Respiratory: Normal respiratory effort without tachypnea or retractions. Lungs CTAB. Good air entry to the bases with no decreased or absent breath sounds. Musculoskeletal: Full range of motion to all extremities. No gross deformities appreciated.  Visualization of the right knee reveals mild edema to the right knee when compared to the left.  Mild ballottement is also appreciated.  No visible erythema.  No warmth to palpation.  Visualization of the rest of the right lower extremity reveals no gross edema, erythema.  Patient is tender to palpation over the patella with no other tenderness to palpation.  No palpable abnormality.  No deficits along the quadriceps tendon or patellar ligament.  Varus, valgus, Lachman's, McMurray's is negative.  Dorsalis pedis pulse intact distally.  Sensation intact distally. Neurologic:  Normal speech and language. No gross focal neurologic deficits are appreciated.  Skin:  Skin is warm, dry and intact. No rash noted. Psychiatric: Mood and affect are normal. Speech and behavior are normal. Patient exhibits appropriate insight and judgement.   ____________________________________________   LABS (all labs ordered are listed, but only abnormal results are displayed)  Labs Reviewed - No data to display ____________________________________________  EKG   ____________________________________________  RADIOLOGY I  personally viewed and evaluated these images as part of my medical decision making, as well as reviewing the written report by the radiologist.  I concur with joint effusion with no acute osseous abnormality.  Dg Knee Complete 4 Views Right  Result Date: 11/17/2017 CLINICAL DATA:  Acute right knee pain and swelling for the past 4 days after heavy lifting. EXAM: RIGHT KNEE - COMPLETE 4+ VIEW COMPARISON:  None. FINDINGS: Large effusion. Minimal posterior patellar spur formation. No fracture or dislocation seen. IMPRESSION: 1. Large effusion. 2. Minimal patellofemoral degenerative changes. Electronically Signed   By: Beckie Salts M.D.   On: 11/17/2017 15:24    ____________________________________________    PROCEDURES  Procedure(s) performed:    Procedures    Medications  predniSONE (DELTASONE) tablet 60 mg (has no administration in time range)     ____________________________________________   INITIAL IMPRESSION / ASSESSMENT AND PLAN / ED COURSE  Pertinent labs & imaging results  that were available during my care of the patient were reviewed by me and considered in my medical decision making (see chart for details).  Review of the [place] CSRS was performed in accordance of the [facility] prior to dispensing any controlled drugs.      Patient's diagnosis is consistent with knee effusion, patellofemoral syndrome.  Patient presents emergency department complaining of left knee pain, swelling.  Patient has reported increased movement/lifting.  No direct trauma to the area.  No signs of infection to include septic arthritis, inflammatory condition such as gout.  Likely patellofemoral syndrome.  X-ray reveals joint effusion with no acute osseous abnormality.  Exam is reassuring with no indication of acute ligamentous rupture.  Patient will be discharged home with prescriptions for prednisone taper as patient has been informed that he should not take NSAIDs by his cardiologist.  Patient will be  prescribed #10 of Ultram for pain relief.. Patient is to follow up with orthopedics as needed or otherwise directed. Patient is given ED precautions to return to the ED for any worsening or new symptoms.     ____________________________________________  FINAL CLINICAL IMPRESSION(S) / ED DIAGNOSES  Final diagnoses:  Effusion of right knee  Patellofemoral pain syndrome of right knee      NEW MEDICATIONS STARTED DURING THIS VISIT:  ED Discharge Orders         Ordered    predniSONE (DELTASONE) 10 MG tablet  Daily    Note to Pharmacy:  Take 6 pills x 2 days, 5 pills x 2 days, 4 pills x 2 days, 3 pills x 2 days, 2 pills x 2 days, and 1 pill x 2 days   11/17/17 1557    traMADol (ULTRAM) 50 MG tablet  Every 6 hours PRN     11/17/17 1557              This chart was dictated using voice recognition software/Dragon. Despite best efforts to proofread, errors can occur which can change the meaning. Any change was purely unintentional.    Racheal Patches, PA-C 11/17/17 1617    Arnaldo Natal, MD 11/18/17 [phone]

## 2017-11-17 NOTE — ED Notes (Signed)
Pt c/o o fright knee pain x4 days that spread to entire leg. Swelling and redness noted. Denies trauma to extremity.

## 2018-01-09 ENCOUNTER — Emergency Department
Admission: EM | Admit: 2018-01-09 | Discharge: 2018-01-09 | Disposition: A | Discharge status: Home / Self Care | Payer: Self-pay | Attending: Emergency Medicine | Admitting: Emergency Medicine

## 2018-01-09 ENCOUNTER — Other Ambulatory Visit: Payer: Self-pay

## 2018-01-09 ENCOUNTER — Emergency Department: Payer: Self-pay

## 2018-01-09 ENCOUNTER — Encounter: Payer: Self-pay | Admitting: Emergency Medicine

## 2018-01-09 DIAGNOSIS — M23304 Other meniscus derangements, unspecified medial meniscus, left knee: Secondary | ICD-10-CM | POA: Insufficient documentation

## 2018-01-09 DIAGNOSIS — Z79899 Other long term (current) drug therapy: Secondary | ICD-10-CM | POA: Insufficient documentation

## 2018-01-09 DIAGNOSIS — Z7982 Long term (current) use of aspirin: Secondary | ICD-10-CM | POA: Insufficient documentation

## 2018-01-09 DIAGNOSIS — M25562 Pain in left knee: Secondary | ICD-10-CM

## 2018-01-09 DIAGNOSIS — Z950 Presence of cardiac pacemaker: Secondary | ICD-10-CM | POA: Insufficient documentation

## 2018-01-09 MED ORDER — MELOXICAM 15 MG PO TABS: 15.0000 mg | ORAL_TABLET | Freq: Every day | ORAL | 0 refills | Status: DC

## 2018-01-09 MED ORDER — MELOXICAM 7.5 MG PO TABS
15.0000 mg | ORAL_TABLET | Freq: Once | ORAL | Status: AC
  Administered 2018-01-09: 15 mg via ORAL
  Filled 2018-01-09: qty 2

## 2018-01-09 NOTE — ED Notes (Signed)
Fitted with knee and crutches.

## 2018-01-09 NOTE — ED Triage Notes (Signed)
L knee pain x 10 days, increasing, denies injury.

## 2018-01-09 NOTE — ED Notes (Signed)
See triage note  Presents with left knee pain  States he noticed some "popping" to knee about 1-2 weeks ago since Sunday pain has increased and he feels like his mobility is decreased   States he is not able to walk  Pain is all thur knee ,laterally and posterior

## 2018-01-09 NOTE — ED Provider Notes (Signed)
[facility] Emergency Department Provider Note  ____________________________________________  Time seen: Approximately 5:54 PM  I have reviewed the triage vital signs and the nursing notes.   HISTORY  Chief Complaint Knee Pain    HPI Timothy Oconnell is a 47 y.o. male who presents the emergency department complaining of left knee pain.  Patient reports no known trauma to the left knee.  He states that he spends long periods standing at work.  He also reports that he typically has twisting while weightbearing on this knee at work.  Patient reports that he has had a clicking/catching sensation with full extension or flexion from full extension position.  Patient reports intermittent swelling to the area.  No erythema.  Patient denies any direct trauma to the knee.  No history of gout.  Patient denies any pain in his calf.  No lower extremity edema.  No chest pain, palpitations, cough.  No history of DVT or PE.  Patient does have a history of heart block, pacemaker, transposition of the great arteries.  No complaints of chronic medical problems.  Patient was seen by myself with nontraumatic knee pain to the right knee.  Patient reports that that has resolved at this time.  He is not taking any medications at this time for either knee.    Past Medical History:  Diagnosis Date  . Heart block   . History of Mustard procedure   . Pacemaker   . Transposition great arteries     Patient Active Problem List   Diagnosis Date Noted  . Chest pain 09/11/2017  . History of transposition of great arteries 09/11/2017  . CHB (complete heart block) (HCC) 09/11/2017  . AKI (acute kidney injury) (HCC) 09/11/2017  . Bronchitis 12/14/2015    Past Surgical History:  Procedure Laterality Date  . CARDIAC SURGERY    . EXPLORATION POST OPERATIVE OPEN HEART      Prior to Admission medications   Medication Sig Start Date End Date Taking? Authorizing Provider  albuterol (PROVENTIL  HFA;VENTOLIN HFA) 108 (90 Base) MCG/ACT inhaler Inhale 2 puffs into the lungs every 6 (six) hours as needed for wheezing or shortness of breath. 12/16/15   Katha Hamming, MD  aspirin EC 81 MG tablet Take 81 mg by mouth daily.    [provider]  calcium carbonate (TUMS - DOSED IN MG ELEMENTAL CALCIUM) 500 MG chewable tablet Chew 1 tablet by mouth 2 (two) times daily as needed for indigestion or heartburn.    [provider]  carvedilol (COREG) 6.25 MG tablet Take 6.25-12.5 mg by mouth 2 (two) times daily. Patient takes 6.25 mg by mouth in the morning and 12.5 mg by mouth at bedtime. 08/28/17   [provider]  Multiple Vitamin (MULTIVITAMIN WITH MINERALS) TABS tablet Take 1 tablet by mouth daily.    [provider]  omeprazole (PRILOSEC OTC) 20 MG tablet Take 20 mg by mouth daily.    [provider]  predniSONE (DELTASONE) 10 MG tablet Take 1 tablet (10 mg total) by mouth daily. 11/17/17   Malayshia All, Delorise Royals, PA-C  tamsulosin (FLOMAX) 0.4 MG CAPS capsule Take 1 capsule (0.4 mg total) by mouth daily after supper. 09/12/17   Alford Highland, MD  traMADol (ULTRAM) 50 MG tablet Take 1 tablet (50 mg total) by mouth every 6 (six) hours as needed. 11/17/17   Alzora Ha, Delorise Royals, PA-C    Allergies Patient has no known allergies.  Family History  Problem Relation Age of Onset  .  Lung cancer Father     Social History Social History   Tobacco Use  . Smoking status: Never Smoker  . Smokeless tobacco: Never Used  Substance Use Topics  . Alcohol use: No  . Drug use: Not on file     Review of Systems  Constitutional: No fever/chills Eyes: No visual changes. Cardiovascular: no chest pain. Respiratory: no cough. No SOB. Gastrointestinal: No abdominal pain.  No nausea, no vomiting.  Musculoskeletal: Positive for nontraumatic left knee pain, catching sensation Skin: Negative for rash, abrasions, lacerations, ecchymosis. Neurological: Negative for  headaches, focal weakness or numbness. 10-point ROS otherwise negative.  ____________________________________________   PHYSICAL EXAM:  VITAL SIGNS: ED Triage Vitals  Enc Vitals Group     BP 01/09/18 1713 120/70     Pulse Rate 01/09/18 1713 69     Resp 01/09/18 1713 18     Temp 01/09/18 1713 98.6 F (37 C)     Temp Source 01/09/18 1713 Oral     SpO2 01/09/18 1713 97 %     Weight 01/09/18 1715 198 lb (89.8 kg)     Height 01/09/18 1715 5\' 7"  (1.702 m)     Head Circumference --      Peak Flow --      Pain Score 01/09/18 1715 8     Pain Loc --      Pain Edu? --      Excl. in GC? --      Constitutional: Alert and oriented. Well appearing and in no acute distress. Eyes: Conjunctivae are normal. PERRL. EOMI. Head: Atraumatic. Neck: No stridor.    Cardiovascular: Normal rate, regular rhythm. Normal S1 and S2.  Good peripheral circulation. Respiratory: Normal respiratory effort without tachypnea or retractions. Lungs CTAB. Good air entry to the bases with no decreased or absent breath sounds. Musculoskeletal: Full range of motion to all extremities. No gross deformities appreciated.  Visualization of the left knee reveals varicose veins extending through the left lower extremity.  No visible erythema, edema, gross edema.  Patient is able to extend and flex the knee appropriately at this time.  Patient is mildly tender palpation medially over the patellar ligament, medial bilateral joint lines.  No palpable abnormality or deficits.  Varus, valgus, Lachman's is negative.  Positive for McMurray's for medial meniscal derangement.  Dorsalis pedis pulse intact distally.  Sensation intact distally. Neurologic:  Normal speech and language. No gross focal neurologic deficits are appreciated.  Skin:  Skin is warm, dry and intact. No rash noted. Psychiatric: Mood and affect are normal. Speech and behavior are normal. Patient exhibits appropriate insight and  judgement.   ____________________________________________   LABS (all labs ordered are listed, but only abnormal results are displayed)  Labs Reviewed - No data to display ____________________________________________  EKG   ____________________________________________  RADIOLOGY I personally viewed and evaluated these images as part of my medical decision making, as well as reviewing the written report by the radiologist.  I concur with radiologist finding of no acute osseous abnormality to the left knee.  No evidence of joint effusion.  Prominent varicosities consistent with varicose veins.  Dg Knee Complete 4 Views Left  Result Date: 01/09/2018 CLINICAL DATA:  LEFT medial and posterior knee pain for 2 weeks. No reported injury. EXAM: LEFT KNEE - COMPLETE 4+ VIEW COMPARISON:  None. FINDINGS: Osseous alignment is normal. Bone mineralization is normal. No fracture line or displaced fracture fragment. No acute or suspicious osseous lesion. No convincing joint effusion. Prominent varicosities within the  subcutaneous soft tissues medial to the distal femur. IMPRESSION: 1. No acute findings. No osseous fracture or dislocation. No degenerative change at the LEFT knee. 2. Prominent varicosities within the superficial soft tissues medial to the distal femur. Electronically Signed   By: Bary Richard M.D.   On: 01/09/2018 18:38    ____________________________________________    PROCEDURES  Procedure(s) performed:    Procedures    Medications  meloxicam (MOBIC) tablet 15 mg (has no administration in time range)     ____________________________________________   INITIAL IMPRESSION / ASSESSMENT AND PLAN / ED COURSE  Pertinent labs & imaging results that were available during my care of the patient were reviewed by me and considered in my medical decision making (see chart for details).  Review of the [place] CSRS was performed in accordance of the [facility] prior to dispensing any  controlled drugs.      Patient's diagnosis is consistent with probable meniscal derangement.  Patient presents the emergency department with pain to the left knee, catching/clicking sensation.  Patient had positive McMurray's test in the emergency department.  X-ray reveals no deformity.  At this point, patient's knee will be immobilized using knee immobilizer, use crutches at home.  Meloxicam for symptom improvement.  Follow-up with orthopedics for further management.  Patient is given ED precautions to return to the ED for any worsening or new symptoms.     ____________________________________________  FINAL CLINICAL IMPRESSION(S) / ED DIAGNOSES  Final diagnoses:  Acute pain of left knee  Derangement of medial meniscus of left knee      NEW MEDICATIONS STARTED DURING THIS VISIT:  ED Discharge Orders    None          This chart was dictated using voice recognition software/Dragon. Despite best efforts to proofread, errors can occur which can change the meaning. Any change was purely unintentional.    Racheal Patches, PA-C 01/09/18 1856    Phineas Semen, MD 01/09/18 [phone]

## 2018-08-18 ENCOUNTER — Other Ambulatory Visit: Payer: Self-pay

## 2018-08-18 ENCOUNTER — Emergency Department: Payer: Self-pay

## 2018-08-18 ENCOUNTER — Encounter: Payer: Self-pay | Admitting: Emergency Medicine

## 2018-08-18 ENCOUNTER — Inpatient Hospital Stay
Admission: EM | Admit: 2018-08-18 | Discharge: 2018-08-21 | DRG: 683 | Disposition: A | Discharge status: Home / Self Care | Payer: Self-pay | Attending: Internal Medicine | Admitting: Internal Medicine

## 2018-08-18 DIAGNOSIS — I5022 Chronic systolic (congestive) heart failure: Secondary | ICD-10-CM | POA: Diagnosis present

## 2018-08-18 DIAGNOSIS — R531 Weakness: Secondary | ICD-10-CM

## 2018-08-18 DIAGNOSIS — E872 Acidosis: Secondary | ICD-10-CM | POA: Diagnosis present

## 2018-08-18 DIAGNOSIS — R197 Diarrhea, unspecified: Secondary | ICD-10-CM | POA: Diagnosis present

## 2018-08-18 DIAGNOSIS — N179 Acute kidney failure, unspecified: Secondary | ICD-10-CM | POA: Diagnosis present

## 2018-08-18 DIAGNOSIS — Z1159 Encounter for screening for other viral diseases: Secondary | ICD-10-CM

## 2018-08-18 DIAGNOSIS — Z79899 Other long term (current) drug therapy: Secondary | ICD-10-CM

## 2018-08-18 DIAGNOSIS — Z8774 Personal history of (corrected) congenital malformations of heart and circulatory system: Secondary | ICD-10-CM

## 2018-08-18 DIAGNOSIS — E875 Hyperkalemia: Secondary | ICD-10-CM | POA: Diagnosis present

## 2018-08-18 DIAGNOSIS — E869 Volume depletion, unspecified: Secondary | ICD-10-CM | POA: Diagnosis present

## 2018-08-18 DIAGNOSIS — I272 Pulmonary hypertension, unspecified: Secondary | ICD-10-CM | POA: Diagnosis present

## 2018-08-18 DIAGNOSIS — I5082 Biventricular heart failure: Secondary | ICD-10-CM | POA: Diagnosis present

## 2018-08-18 DIAGNOSIS — I442 Atrioventricular block, complete: Secondary | ICD-10-CM | POA: Diagnosis present

## 2018-08-18 DIAGNOSIS — Z950 Presence of cardiac pacemaker: Secondary | ICD-10-CM

## 2018-08-18 DIAGNOSIS — I420 Dilated cardiomyopathy: Secondary | ICD-10-CM | POA: Diagnosis present

## 2018-08-18 DIAGNOSIS — E860 Dehydration: Secondary | ICD-10-CM | POA: Diagnosis present

## 2018-08-18 DIAGNOSIS — Z7982 Long term (current) use of aspirin: Secondary | ICD-10-CM

## 2018-08-18 DIAGNOSIS — N170 Acute kidney failure with tubular necrosis: Principal | ICD-10-CM | POA: Diagnosis present

## 2018-08-18 DIAGNOSIS — I959 Hypotension, unspecified: Secondary | ICD-10-CM | POA: Diagnosis present

## 2018-08-18 DIAGNOSIS — Q249 Congenital malformation of heart, unspecified: Secondary | ICD-10-CM

## 2018-08-18 LAB — URINALYSIS, COMPLETE (UACMP) WITH MICROSCOPIC
Bacteria, UA: NONE SEEN
Bilirubin Urine: NEGATIVE
Glucose, UA: NEGATIVE mg/dL
Hgb urine dipstick: NEGATIVE
Ketones, ur: NEGATIVE mg/dL
Leukocytes,Ua: NEGATIVE
Nitrite: NEGATIVE
Protein, ur: NEGATIVE mg/dL
Specific Gravity, Urine: 1.013 (ref 1.005–1.030)
pH: 5 (ref 5.0–8.0)

## 2018-08-18 LAB — CBC
HCT: 34.8 % — ABNORMAL LOW (ref 39.0–52.0)
HCT: 36.4 % — ABNORMAL LOW (ref 39.0–52.0)
Hemoglobin: 11.4 g/dL — ABNORMAL LOW (ref 13.0–17.0)
Hemoglobin: 11.9 g/dL — ABNORMAL LOW (ref 13.0–17.0)
MCH: 27.5 pg (ref 26.0–34.0)
MCH: 27.9 pg (ref 26.0–34.0)
MCHC: 32.8 g/dL (ref 30.0–36.0)
MCHC: 32.7 g/dL (ref 30.0–36.0)
MCV: 84.1 fL (ref 80.0–100.0)
MCV: 85.4 fL (ref 80.0–100.0)
Platelets: 124 10*3/uL — ABNORMAL LOW (ref 150–400)
Platelets: 139 10*3/uL — ABNORMAL LOW (ref 150–400)
RBC: 4.14 MIL/uL — ABNORMAL LOW (ref 4.22–5.81)
RBC: 4.26 MIL/uL (ref 4.22–5.81)
RDW: 17.2 % — ABNORMAL HIGH (ref 11.5–15.5)
RDW: 17.4 % — ABNORMAL HIGH (ref 11.5–15.5)
WBC: 6.9 10*3/uL (ref 4.0–10.5)
WBC: 8.2 10*3/uL (ref 4.0–10.5)
nRBC: 0 % (ref 0.0–0.2)
nRBC: 0 % (ref 0.0–0.2)

## 2018-08-18 LAB — BASIC METABOLIC PANEL
Anion gap: 10 (ref 5–15)
BUN: 85 mg/dL — ABNORMAL HIGH (ref 6–20)
CO2: 13 mmol/L — ABNORMAL LOW (ref 22–32)
Calcium: 8.8 mg/dL — ABNORMAL LOW (ref 8.9–10.3)
Chloride: 110 mmol/L (ref 98–111)
Creatinine, Ser: 3.85 mg/dL — ABNORMAL HIGH (ref 0.61–1.24)
GFR calc Af Amer: 20 mL/min — ABNORMAL LOW (ref >60)
GFR calc non Af Amer: 17 mL/min — ABNORMAL LOW (ref >60)
Glucose, Bld: 140 mg/dL — ABNORMAL HIGH (ref 70–99)
Potassium: 5.2 mmol/L — ABNORMAL HIGH (ref 3.5–5.1)
Sodium: 133 mmol/L — ABNORMAL LOW (ref 135–145)

## 2018-08-18 LAB — LACTIC ACID, PLASMA
Lactic Acid, Venous: 0.5 mmol/L (ref 0.5–1.9)
Lactic Acid, Venous: 1.2 mmol/L (ref 0.5–1.9)

## 2018-08-18 LAB — TROPONIN I: Troponin I: 0.03 ng/mL (ref <0.03)

## 2018-08-18 LAB — CREATININE, SERUM
Creatinine, Ser: 2.91 mg/dL — ABNORMAL HIGH (ref 0.61–1.24)
GFR calc Af Amer: 28 mL/min — ABNORMAL LOW (ref >60)
GFR calc non Af Amer: 24 mL/min — ABNORMAL LOW (ref >60)

## 2018-08-18 LAB — CK: Total CK: 40 U/L — ABNORMAL LOW (ref 49–397)

## 2018-08-18 LAB — SARS CORONAVIRUS 2 BY RT PCR (HOSPITAL ORDER, PERFORMED IN ~~LOC~~ HOSPITAL LAB): SARS Coronavirus 2: NEGATIVE

## 2018-08-18 MED ORDER — OMEPRAZOLE MAGNESIUM 20 MG PO TBEC: 20.0000 mg | DELAYED_RELEASE_TABLET | Freq: Every day | ORAL | Status: DC

## 2018-08-18 MED ORDER — SODIUM CHLORIDE 0.9 % IV SOLN
INTRAVENOUS | Status: DC
  Administered 2018-08-18 – 2018-08-19 (×2): via INTRAVENOUS

## 2018-08-18 MED ORDER — CALCIUM CARBONATE ANTACID 500 MG PO CHEW: 1.0000 | CHEWABLE_TABLET | Freq: Two times a day (BID) | ORAL | Status: DC | PRN

## 2018-08-18 MED ORDER — SODIUM CHLORIDE 0.9 % IV BOLUS
500.0000 mL | Freq: Once | INTRAVENOUS | Status: AC
  Administered 2018-08-19: 500 mL via INTRAVENOUS

## 2018-08-18 MED ORDER — SODIUM CHLORIDE 0.9 % IV SOLN
Freq: Once | INTRAVENOUS | Status: AC
  Administered 2018-08-18: 14:00:00 via INTRAVENOUS

## 2018-08-18 MED ORDER — ALBUTEROL SULFATE (2.5 MG/3ML) 0.083% IN NEBU: 2.5000 mg | INHALATION_SOLUTION | Freq: Four times a day (QID) | RESPIRATORY_TRACT | Status: DC

## 2018-08-18 MED ORDER — ASPIRIN EC 81 MG PO TBEC
81.0000 mg | DELAYED_RELEASE_TABLET | Freq: Every day | ORAL | Status: DC
  Administered 2018-08-19 – 2018-08-21 (×3): 81 mg via ORAL
  Filled 2018-08-18 (×3): qty 1

## 2018-08-18 MED ORDER — PANTOPRAZOLE SODIUM 40 MG PO TBEC
40.0000 mg | DELAYED_RELEASE_TABLET | Freq: Every day | ORAL | Status: DC
  Administered 2018-08-19 – 2018-08-21 (×3): 40 mg via ORAL
  Filled 2018-08-18 (×3): qty 1

## 2018-08-18 MED ORDER — SODIUM CHLORIDE 0.9 % IV BOLUS
1000.0000 mL | Freq: Once | INTRAVENOUS | Status: AC
  Administered 2018-08-18: 1000 mL via INTRAVENOUS

## 2018-08-18 MED ORDER — ADULT MULTIVITAMIN W/MINERALS CH
1.0000 | ORAL_TABLET | Freq: Every day | ORAL | Status: DC
  Administered 2018-08-19 – 2018-08-21 (×3): 1 via ORAL
  Filled 2018-08-18 (×3): qty 1

## 2018-08-18 MED ORDER — HEPARIN SODIUM (PORCINE) 5000 UNIT/ML IJ SOLN
5000.0000 [IU] | Freq: Three times a day (TID) | INTRAMUSCULAR | Status: DC
  Administered 2018-08-19 – 2018-08-20 (×3): 5000 [IU] via SUBCUTANEOUS
  Filled 2018-08-18 (×4): qty 1

## 2018-08-18 MED ORDER — SODIUM CHLORIDE 0.9% FLUSH
3.0000 mL | Freq: Once | INTRAVENOUS | Status: AC
  Administered 2018-08-18: 13:00:00 3 mL via INTRAVENOUS

## 2018-08-18 MED ORDER — ALBUTEROL SULFATE (2.5 MG/3ML) 0.083% IN NEBU: 2.5000 mg | INHALATION_SOLUTION | Freq: Four times a day (QID) | RESPIRATORY_TRACT | Status: DC | PRN

## 2018-08-18 MED ORDER — ACETAMINOPHEN 500 MG PO TABS: 1000.0000 mg | ORAL_TABLET | Freq: Four times a day (QID) | ORAL | Status: DC | PRN

## 2018-08-18 NOTE — ED Provider Notes (Signed)
[facility] Emergency Department Provider Note  ____________________________________________   First MD Initiated Contact with Patient 08/18/18 1217     (approximate)  I have reviewed the triage vital signs and the nursing notes.   HISTORY  Chief Complaint Weakness    HPI Timothy Oconnell is a 48 y.o. 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  Patient states that over the last several days, he has had progressively worsening generalized weakness.  He works at [employer], and states he has been walking more to work in the heat, as well as been standing more at work.  He states that over the last several days, he has noticed lightheadedness upon standing and weakness and difficulty walking to work.  Denies any chest pain.  Is had some mild nausea and admits to somewhat poor p.o. intake.  No diarrhea or vomiting.  No fever, cough, or chills.  No known specific sick contacts.  He notes that his energy is decreased and with any amount of walking, he feels very weak and lightheaded, like he is going to pass out.  He does state that symptoms feel somewhat similar to the last time he had heart block, during which she required pacemaker placement.  He has not changed any of his medications recently.        Past Medical History:  Diagnosis Date  . Heart block   . History of Mustard procedure   . Pacemaker   . Transposition great arteries     Patient Active Problem List   Diagnosis Date Noted  . Chest pain 09/11/2017  . History of transposition of great arteries 09/11/2017  . CHB (complete heart block) (HCC) 09/11/2017  . AKI (acute kidney injury) (HCC) 09/11/2017  . Bronchitis 12/14/2015    Past Surgical History:  Procedure Laterality Date  . CARDIAC SURGERY    . EXPLORATION POST OPERATIVE OPEN HEART      Prior to Admission medications    Medication Sig Start Date End Date Taking? Authorizing Provider  aspirin EC 81 MG tablet Take 81 mg by mouth daily.   Yes [provider]  carvedilol (COREG) 25 MG tablet Take 25 mg by mouth 2 (two) times daily. 07/22/18  Yes [provider]  furosemide (LASIX) 40 MG tablet Take 40 mg by mouth daily. 08/07/18  Yes [provider]  lisinopril (ZESTRIL) 20 MG tablet Take 20 mg by mouth daily. 03/02/18  Yes [provider]  Multiple Vitamin (MULTIVITAMIN WITH MINERALS) TABS tablet Take 1 tablet by mouth daily.   Yes [provider]  omeprazole (PRILOSEC OTC) 20 MG tablet Take 20 mg by mouth daily.   Yes [provider]  spironolactone (ALDACTONE) 25 MG tablet Take 25 mg by mouth daily. 08/07/18  Yes [provider]  acetaminophen (TYLENOL) 500 MG tablet Take 1,000 mg by mouth every 6 (six) hours as needed for pain.    [provider]  albuterol (PROVENTIL HFA;VENTOLIN HFA) 108 (90 Base) MCG/ACT inhaler Inhale 2 puffs into the lungs every 6 (six) hours as needed for wheezing or shortness of breath. 12/16/15   Katha HammingKonidena, Snehalatha, MD  calcium carbonate (TUMS - DOSED IN MG ELEMENTAL CALCIUM) 500 MG chewable tablet Chew 1 tablet by mouth 2 (two) times daily as needed for indigestion or heartburn.    [provider]    Allergies Patient has no known allergies.  Family History  Problem Relation Age of Onset  . Lung cancer Father     Social History Social History   Tobacco Use  . Smoking status: Never Smoker  . Smokeless tobacco: Never Used  Substance Use Topics  . Alcohol use: No  . Drug use: Not on file    Review of Systems Review of Systems  Constitutional: Positive for fatigue. Negative for chills and fever.  HENT: Negative for congestion and rhinorrhea.   Eyes: Negative for visual disturbance.  Respiratory: Negative for cough and shortness of breath.   Gastrointestinal: Positive for nausea. Negative for  abdominal pain, diarrhea and vomiting.  Genitourinary: Negative for dysuria and flank pain.  Musculoskeletal: Positive for back pain.  Skin: Negative for rash and wound.  Neurological: Positive for weakness. Negative for light-headedness.  All other systems reviewed and are negative.    ____________________________________________  PHYSICAL EXAM:  VITAL SIGNS: ED Triage Vitals  Enc Vitals Group     BP      Pulse      Resp      Temp      Temp src      SpO2      Weight      Height      Head Circumference      Peak Flow      Pain Score      Pain Loc      Pain Edu?      Excl. in GC?     Physical Exam Vitals signs and nursing note reviewed.  Constitutional:      General: He is not in acute distress.    Appearance: He is well-developed.  HENT:     Head: Normocephalic and atraumatic.     Comments: Dry mucous membranes Eyes:     Conjunctiva/sclera: Conjunctivae normal.  Neck:     Musculoskeletal: Neck supple.  Cardiovascular:     Rate and Rhythm: Normal rate and regular rhythm.     Heart sounds: Normal heart sounds. No murmur. No friction rub.  Pulmonary:     Effort: Pulmonary effort is normal. No respiratory distress.     Breath sounds: Normal breath sounds. No wheezing or rales.  Abdominal:     General: There is no distension.     Palpations: Abdomen is soft.     Tenderness: There is no abdominal tenderness.  Musculoskeletal:     Right lower leg: Edema (1+, pitting) present.     Left lower leg: Edema (1+, pitting) present.  Skin:    General: Skin is warm.     Capillary Refill: Capillary refill takes less than 2 seconds.  Neurological:     Mental Status: He is alert and oriented to person, place, and time.     Motor: No abnormal muscle tone.       ____________________________________________   LABS (all labs ordered are listed, but only abnormal results are displayed)  Labs Reviewed  BASIC METABOLIC PANEL - Abnormal; Notable for the following  components:      Result Value   Sodium 133 (*)    Potassium 5.2 (*)    CO2 13 (*)    Glucose, Bld 140 (*)    BUN 85 (*)    Creatinine, Ser 3.85 (*)    Calcium 8.8 (*)    GFR calc non Af Amer 17 (*)    GFR calc Af Amer 20 (*)    All other components within normal limits  CBC - Abnormal; Notable for the following components:  RBC 4.14 (*)    Hemoglobin 11.4 (*)    HCT 34.8 (*)    RDW 17.2 (*)    Platelets 124 (*)    All other components within normal limits  URINALYSIS, COMPLETE (UACMP) WITH MICROSCOPIC - Abnormal; Notable for the following components:   Color, Urine YELLOW (*)    APPearance CLEAR (*)    All other components within normal limits  TROPONIN I - Abnormal; Notable for the following components:   Troponin I 0.03 (*)    All other components within normal limits  CK - Abnormal; Notable for the following components:   Total CK 40 (*)    All other components within normal limits  CBC - Abnormal; Notable for the following components:   Hemoglobin 11.9 (*)    HCT 36.4 (*)    RDW 17.4 (*)    Platelets 139 (*)    All other components within normal limits  CREATININE, SERUM - Abnormal; Notable for the following components:   Creatinine, Ser 2.91 (*)    GFR calc non Af Amer 24 (*)    GFR calc Af Amer 28 (*)    All other components within normal limits  SARS CORONAVIRUS 2 (HOSPITAL ORDER, PERFORMED IN [facility])  LACTIC ACID, PLASMA  LACTIC ACID, PLASMA    ____________________________________________  EKG: AV dual paced rhythm, rate 70.  QRS 198.  QTc 524.  No apparent changes from previous.  No apparent ischemic changes.  No Sgarbossa criteria. ________________________________________  RADIOLOGY All imaging, including plain films, CT scans, and ultrasounds, independently reviewed by me, and interpretations confirmed via formal radiology reads.  ED MD interpretation:  Cardiomegaly, no acute abnormality.  Official radiology report(s): Dg Chest  Port 1 View  Result Date: 08/18/2018 CLINICAL DATA:  Weakness and nausea for 2 days. EXAM: PORTABLE CHEST 1 VIEW COMPARISON:  In single-view of the chest 09/10/2017 and 12/14/2015. CT chest 12/14/2015. FINDINGS: Pacing device and stent in the superior vena cava are unchanged. There is cardiomegaly without edema. No consolidative process, pneumothorax or effusion. No acute or focal bony abnormality. IMPRESSION: Cardiomegaly without acute disease. Electronically Signed   By: Drusilla Kannerhomas  Dalessio M.D.   On: 08/18/2018 13:22    ____________________________________________  PROCEDURES   Procedure(s) performed (including Critical Care):  Procedures  ____________________________________________  INITIAL IMPRESSION / MDM / ASSESSMENT AND PLAN / ED COURSE  As part of my medical decision making, I reviewed the following data within the electronic MEDICAL RECORD NUMBER Notes from prior ED visits and [place] Controlled Substance Database      *Timothy Oconnell was evaluated in Emergency Department on 08/18/2018 for the symptoms described in the history of present illness. He was evaluated in the context of the global COVID-19 pandemic, which necessitated consideration that the patient might be at risk for infection with the SARS-CoV-2 virus that causes COVID-19. Institutional protocols and algorithms that pertain to the evaluation of patients at risk for COVID-19 are in a state of rapid change based on information released by regulatory bodies including the CDC and federal and state organizations. These policies and algorithms were followed during the patient's care in the ED.  Some ED evaluations and interventions may be delayed as a result of limited staffing during the pandemic.*   Clinical Course as of Aug 17 2112  Mon Aug 18, 2018  1448 Reviewed prior admission from July.  Patient saw Tomasa Randunningham with cardiology and Dr. Thedore MinsSingh with nephrology.   [CI]    Clinical Course User Index [  CI] Duffy Bruce, MD     Medical Decision Making: 48 yo M here with generalized weakness. Labs reviewed. Pt with baseline chronic trop elevation, but labs most concerning for AoCKD likely pre-renal. LA normal, normal WBC, no fever, no signs to suggest acute infection. CXR, UA w/o infection. Covid negative. Will admit for hydration, treatment of AKI, mild hypotension.  ____________________________________________  FINAL CLINICAL IMPRESSION(S) / ED DIAGNOSES  Final diagnoses:  AKI (acute kidney injury) ([place])  Generalized weakness  Congenital heart defect     MEDICATIONS GIVEN DURING THIS VISIT:  Medications  acetaminophen (TYLENOL) tablet 1,000 mg (has no administration in time range)  aspirin EC tablet 81 mg (has no administration in time range)  calcium carbonate (TUMS - dosed in mg elemental calcium) chewable tablet 200 mg of elemental calcium (has no administration in time range)  multivitamin with minerals tablet 1 tablet (has no administration in time range)  albuterol (PROVENTIL) (2.5 MG/3ML) 0.083% nebulizer solution 2.5 mg (has no administration in time range)  heparin injection 5,000 Units (has no administration in time range)  0.9 %  sodium chloride infusion ( Intravenous Rate/Dose Verify 08/18/18 1946)  pantoprazole (PROTONIX) EC tablet 40 mg (has no administration in time range)  sodium chloride flush (NS) 0.9 % injection 3 mL (3 mLs Intravenous Given 08/18/18 1238)  sodium chloride 0.9 % bolus 1,000 mL (0 mLs Intravenous Stopped 08/18/18 1354)  0.9 %  sodium chloride infusion ( Intravenous Stopped 08/18/18 1638)     ED Discharge Orders    None       Note:  This document was prepared using Dragon voice recognition software and may include unintentional dictation errors.   Duffy Bruce, MD 08/18/18 2114

## 2018-08-18 NOTE — Progress Notes (Signed)
Ch spent time educating pt on assigning a HPOA. Pt shared that he would like to discuss w/ family. Ch understood and provided a copy of a AD and placed it in his bookbag. No further needs at this time.    08/18/18 1800  Clinical Encounter Type  Visited With Patient  Visit Type Other (Comment) (AD education )  Referral From Physician  Consult/Referral To Chaplain  Stress Factors  Patient Stress Factors Major life changes  Advance Directives (For Healthcare)  Does Patient Have a Medical Advance Directive? No  Would patient like information on creating a medical advance directive? Yes (Inpatient - patient requests chaplain consult to create a medical advance directive);No - Patient declined  Echo  Does Patient Have a Mental Health Advance Directive? No  Would patient like information on creating a mental health advance directive? No - Patient declined

## 2018-08-18 NOTE — H&P (Signed)
[employer] [facility] [place] [facility]   PATIENT NAME: Michaele OfferJohn Ghee    MR#:  [id number]  DATE OF BIRTH:  February 06, 1971  DATE OF ADMISSION:  08/18/2018  PRIMARY CARE PHYSICIAN: Patient, No Pcp Per   REQUESTING/REFERRING PHYSICIAN:   CHIEF COMPLAINT:   Chief Complaint  Patient presents with  . Weakness    HISTORY OF PRESENT ILLNESS: Michaele OfferJohn Zogg  is a 48 y.o. male with a known history per below presents with 2-day history of progressive generalized weakness, fatigue, poor p.o. intake, lightheadedness, dizziness, similar to previous admission to the hospital, in the emergency room patient was noted to be hypertensive with blood pressure in the 80s systolic, potassium 5.2, creatinine 3.8 up from 2.3, hemoglobin 11.4, chest x-ray noted for cardiomegaly, hospitalist asked to admit, patient valuated emergency room, no apparent distress, resting comfortably in bed, patient now be admitted for acute kidney injury, mild acute dehydration was likely secondary to medication side effect.  PAST MEDICAL HISTORY:   Past Medical History:  Diagnosis Date  . Heart block   . History of Mustard procedure   . Pacemaker   . Transposition great arteries     PAST SURGICAL HISTORY:  Past Surgical History:  Procedure Laterality Date  . CARDIAC SURGERY    . EXPLORATION POST OPERATIVE OPEN HEART      SOCIAL HISTORY:  Social History   Tobacco Use  . Smoking status: Never Smoker  . Smokeless tobacco: Never Used  Substance Use Topics  . Alcohol use: No    FAMILY HISTORY:  Family History  Problem Relation Age of Onset  . Lung cancer Father     DRUG ALLERGIES: No Known Allergies  REVIEW OF SYSTEMS:   CONSTITUTIONAL: No fever,+ fatigue   EYES: No blurred or double vision.  EARS, NOSE, AND THROAT: No tinnitus or ear pain.  RESPIRATORY: No cough, shortness of breath, wheezing or hemoptysis.  CARDIOVASCULAR: No chest pain, orthopnea, edema.  GASTROINTESTINAL: No nausea, vomiting,  diarrhea or abdominal pain.  GENITOURINARY: No dysuria, hematuria.  ENDOCRINE: No polyuria, nocturia,  HEMATOLOGY: No anemia, easy bruising or bleeding SKIN: No rash or lesion. MUSCULOSKELETAL: No joint pain or arthritis.   NEUROLOGIC: No tingling, numbness, weakness.  PSYCHIATRY: No anxiety or depression.   MEDICATIONS AT HOME:  Prior to Admission medications   Medication Sig Start Date End Date Taking? Authorizing Provider  aspirin EC 81 MG tablet Take 81 mg by mouth daily.   Yes [provider]  carvedilol (COREG) 25 MG tablet Take 25 mg by mouth 2 (two) times daily. 07/22/18  Yes [provider]  furosemide (LASIX) 40 MG tablet Take 40 mg by mouth daily. 08/07/18  Yes [provider]  lisinopril (ZESTRIL) 20 MG tablet Take 20 mg by mouth daily. 03/02/18  Yes [provider]  Multiple Vitamin (MULTIVITAMIN WITH MINERALS) TABS tablet Take 1 tablet by mouth daily.   Yes [provider]  omeprazole (PRILOSEC OTC) 20 MG tablet Take 20 mg by mouth daily.   Yes [provider]  spironolactone (ALDACTONE) 25 MG tablet Take 25 mg by mouth daily. 08/07/18  Yes [provider]  acetaminophen (TYLENOL) 500 MG tablet Take 1,000 mg by mouth every 6 (six) hours as needed for pain.    [provider]  albuterol (PROVENTIL HFA;VENTOLIN HFA) 108 (90 Base) MCG/ACT inhaler Inhale 2 puffs into the lungs every 6 (six) hours as needed for wheezing or shortness of breath. 12/16/15   Katha HammingKonidena, Snehalatha, MD  calcium  carbonate (TUMS - DOSED IN MG ELEMENTAL CALCIUM) 500 MG chewable tablet Chew 1 tablet by mouth 2 (two) times daily as needed for indigestion or heartburn.    [provider]  meloxicam (MOBIC) 15 MG tablet Take 1 tablet (15 mg total) by mouth daily. Patient not taking: Reported on 08/18/2018 01/09/18   Cuthriell, Charline Bills, PA-C  predniSONE (DELTASONE) 10 MG tablet Take 1 tablet (10 mg total) by mouth daily. Patient not  taking: Reported on 08/18/2018 11/17/17   Cuthriell, Charline Bills, PA-C  tamsulosin (FLOMAX) 0.4 MG CAPS capsule Take 1 capsule (0.4 mg total) by mouth daily after supper. Patient not taking: Reported on 08/18/2018 09/12/17   Loletha Grayer, MD  traMADol (ULTRAM) 50 MG tablet Take 1 tablet (50 mg total) by mouth every 6 (six) hours as needed. Patient not taking: Reported on 08/18/2018 11/17/17   Cuthriell, Charline Bills, PA-C      PHYSICAL EXAMINATION:   VITAL SIGNS: Blood pressure (!) 89/62, pulse 63, temperature (!) 97.5 F (36.4 C), resp. rate 20, height 5\' 7"  (1.702 m), weight 81.6 kg, SpO2 97 %.  GENERAL:  48 y.o.-year-old patient lying in the bed with no acute distress.  EYES: Pupils equal, round, reactive to light and accommodation. No scleral icterus. Extraocular muscles intact.  HEENT: Head atraumatic, normocephalic. Oropharynx and nasopharynx clear.  NECK:  Supple, no jugular venous distention. No thyroid enlargement, no tenderness.  LUNGS: Normal breath sounds bilaterally, no wheezing, rales,rhonchi or crepitation. No use of accessory muscles of respiration.  CARDIOVASCULAR: S1, S2 normal. No murmurs, rubs, or gallops.  ABDOMEN: Soft, nontender, nondistended. Bowel sounds present. No organomegaly or mass.  EXTREMITIES: No pedal edema, cyanosis, or clubbing.  NEUROLOGIC: Cranial nerves II through XII are intact. Muscle strength 5/5 in all extremities. Sensation intact. Gait not checked.  PSYCHIATRIC: The patient is alert and oriented x 3.  SKIN: No obvious rash, lesion, or ulcer.   LABORATORY PANEL:   CBC Recent Labs  Lab 08/18/18 1237  WBC 6.9  HGB 11.4*  HCT 34.8*  PLT 124*  MCV 84.1  MCH 27.5  MCHC 32.8  RDW 17.2*   ------------------------------------------------------------------------------------------------------------------  Chemistries  Recent Labs  Lab 08/18/18 1237  NA 133*  K 5.2*  CL 110  CO2 13*  GLUCOSE 140*  BUN 85*  CREATININE 3.85*  CALCIUM 8.8*    ------------------------------------------------------------------------------------------------------------------ estimated creatinine clearance is 23.7 mL/min (A) (by C-G formula based on SCr of 3.85 mg/dL (H)). ------------------------------------------------------------------------------------------------------------------ No results for input(s): TSH, T4TOTAL, T3FREE, THYROIDAB in the last 72 hours.  Invalid input(s): FREET3   Coagulation profile No results for input(s): INR, PROTIME in the last 168 hours. ------------------------------------------------------------------------------------------------------------------- No results for input(s): DDIMER in the last 72 hours. -------------------------------------------------------------------------------------------------------------------  Cardiac Enzymes Recent Labs  Lab 08/18/18 1237  TROPONINI 0.03*   ------------------------------------------------------------------------------------------------------------------ Invalid input(s): POCBNP  ---------------------------------------------------------------------------------------------------------------  Urinalysis    Component Value Date/Time   COLORURINE YELLOW (A) 08/18/2018 1237   APPEARANCEUR CLEAR (A) 08/18/2018 1237   LABSPEC 1.013 08/18/2018 1237   PHURINE 5.0 08/18/2018 1237   GLUCOSEU NEGATIVE 08/18/2018 1237   HGBUR NEGATIVE 08/18/2018 1237   BILIRUBINUR NEGATIVE 08/18/2018 1237   KETONESUR NEGATIVE 08/18/2018 1237   PROTEINUR NEGATIVE 08/18/2018 1237   NITRITE NEGATIVE 08/18/2018 1237   LEUKOCYTESUR NEGATIVE 08/18/2018 1237     RADIOLOGY: Dg Chest Port 1 View  Result Date: 08/18/2018 CLINICAL DATA:  Weakness and nausea for 2 days. EXAM: PORTABLE CHEST 1 VIEW COMPARISON:  In single-view of the chest 09/10/2017 and  12/14/2015. CT chest 12/14/2015. FINDINGS: Pacing device and stent in the superior vena cava are unchanged. There is cardiomegaly without edema.  No consolidative process, pneumothorax or effusion. No acute or focal bony abnormality. IMPRESSION: Cardiomegaly without acute disease. Electronically Signed   By: Drusilla Kannerhomas  Dalessio M.D.   On: 08/18/2018 13:22    EKG: Orders placed or performed during the hospital encounter of 08/18/18  . ED EKG  . ED EKG    IMPRESSION AND PLAN: *AKI with chronic kidney disease stage III Baseline creatinine around 2.3 Most likely secondary to poor p.o. intake and diuretic side effect Admit to regular nursing for bed, nephrology consulted for expert opinion, check renal ultrasound, strict I&O monitoring, daily weights, gentle IV fluids for rehydration given history of heart failure  *Acute dehydration IV fluids for rehydration  *Acute hyperkalemia Conservative medical management, IV fluids for rehydration, BMP in the morning  *Chronic diastolic congestive heart failure without exacerbation Hold beta-blocker therapy/lisinopril/diuretics due to hypotension, continue aspirin, strict I&O monitoring, daily weights, check echocardiogram given hypotension, and continue close medical monitoring  *History of transposition of the great vessels with history of heart block, s/p pacer Stable  Conservative medical management  All the records are reviewed and case discussed with ED provider. Management plans discussed with the patient, family and they are in agreement.  CODE STATUS:full Code Status History    Date Active Date Inactive Code Status Order ID Comments User Context   09/11/2017 0129 09/12/2017 1436 Full Code [id number]  Oralia ManisWillis, David, MD ED   12/14/2015 1849 12/16/2015 1640 Full Code [id number]  Milagros LollSudini, Srikar, MD ED       TOTAL TIME TAKING CARE OF THIS PATIENT: 40 minutes.    Evelena AsaMontell D Natha Guin M.D on 08/18/2018   Between 7am to 6pm - Pager - [phone]  After 6pm go to [email or web address] - password [email or web address]  [facility] [place] [facility]  Office  [phone]  CC: Primary care physician; Patient, No  Pcp Per   Note: This dictation was prepared with Dragon dictation along with smaller phrase technology. Any transcriptional errors that result from this process are unintentional.

## 2018-08-18 NOTE — Progress Notes (Signed)
Family Meeting Note  Advance Directive:yes  Today a meeting took place with the Patient.  Patient is able to participate   The following clinical team members were present during this meeting:MD  The following were discussed:Patient's diagnosis: 48 y.o. male with a known history per below presents with 2-day history of progressive generalized weakness, fatigue, poor p.o. intake, lightheadedness, dizziness, similar to previous admission to the hospital, in the emergency room patient was noted to be hypertensive with blood pressure in the 91Y systolic, potassium 5.2, creatinine 3.8 up from 2.3, hemoglobin 11.4, chest x-ray noted for cardiomegaly, hospitalist asked to admit, patient valuated emergency room, no apparent distress, resting comfortably in bed, patient now be admitted for acute kidney injury, mild acute dehydration was likely secondary to medication side effect, Patient's progosis: Unable to determine and Goals for treatment: Full Code  Additional follow-up to be provided: prn  Time spent during discussion:20 minutes  Gorden Harms, MD

## 2018-08-18 NOTE — Progress Notes (Signed)
Patient blood pressure currently 70/49 Provider paged. Patient is asymptomatic alert and oriented times 4

## 2018-08-18 NOTE — Progress Notes (Signed)
MD notified: Potassium is 5.2  would you like to order anything to help lower it down.

## 2018-08-18 NOTE — ED Notes (Addendum)
ED TO INPATIENT HANDOFF REPORT  ED Nurse Name and Phone #: Geraldine ContrasDee [phone]  S Name/Age/Gender Timothy Oconnell 48 y.o. male Room/Bed: ED12A/ED12A  Code Status   Code Status: Prior  Home/SNF/Other Home Patient oriented to: self, place, time and situation Is this baseline? Yes   Triage Complete: Triage complete  Chief Complaint fall ems  Triage Note Weakness and nausea x 2 days. Denies chest pain or sob. Denies fever or cough. States has nausea but no emesis.   Allergies No Known Allergies  Level of Care/Admitting Diagnosis ED Disposition    ED Disposition Condition Comment   Admit  Hospital Area: [facility] [[id number]]  Level of Care: Med-Surg [16]  Covid Evaluation: Confirmed COVID Negative  Diagnosis: AKI (acute kidney injury) [facility](HCC) [[id number]][[id number]]  Admitting Physician: Bertrum SolSALARY, MONTELL D [[id number]]  Attending Physician: Bertrum SolSALARY, MONTELL D [[id number]]  Estimated length of stay: past midnight tomorrow  Certification:: I certify this patient will need inpatient services for at least 2 midnights  PT Class (Do Not Modify): Inpatient [101]  PT Acc Code (Do Not Modify): Private [1]       B Medical/Surgery History Past Medical History:  Diagnosis Date  . Heart block   . History of Mustard procedure   . Pacemaker   . Transposition great arteries    Past Surgical History:  Procedure Laterality Date  . CARDIAC SURGERY    . EXPLORATION POST OPERATIVE OPEN HEART       A IV Location/Drains/Wounds Patient Lines/Drains/Airways Status   Active Line/Drains/Airways    Name:   Placement date:   Placement time:   Site:   Days:   Peripheral IV 08/18/18 Left Wrist   08/18/18    1225    Wrist   less than 1   Peripheral IV 08/18/18 Left Antecubital   08/18/18    1330    Antecubital   less than 1          Intake/Output Last 24 hours No intake or output data in the 24 hours ending 08/18/18 1703  Labs/Imaging Results for orders placed or performed during the hospital  encounter of 08/18/18 (from the past 48 hour(s))  Basic metabolic panel     Status: Abnormal   Collection Time: 08/18/18 12:37 PM  Result Value Ref Range   Sodium 133 (L) 135 - 145 mmol/L   Potassium 5.2 (H) 3.5 - 5.1 mmol/L   Chloride 110 98 - 111 mmol/L   CO2 13 (L) 22 - 32 mmol/L   Glucose, Bld 140 (H) 70 - 99 mg/dL   BUN 85 (H) 6 - 20 mg/dL   Creatinine, Ser 6.963.85 (H) 0.61 - 1.24 mg/dL   Calcium 8.8 (L) 8.9 - 10.3 mg/dL   GFR calc non Af Amer 17 (L) >60 mL/min   GFR calc Af Amer 20 (L) >60 mL/min   Anion gap 10 5 - 15    Comment: Performed at [facility], [address] [place], [place] [address]  CBC     Status: Abnormal   Collection Time: 08/18/18 12:37 PM  Result Value Ref Range   WBC 6.9 4.0 - 10.5 K/uL   RBC 4.14 (L) 4.22 - 5.81 MIL/uL   Hemoglobin 11.4 (L) 13.0 - 17.0 g/dL   HCT 41.334.8 (L) 24.439.0 - 01.052.0 %   MCV 84.1 80.0 - 100.0 fL   MCH 27.5 26.0 - 34.0 pg   MCHC 32.8 30.0 - 36.0 g/dL   RDW 27.217.2 (H) 53.611.5 - 64.415.5 %  Platelets 124 (L) 150 - 400 K/uL   nRBC 0.0 0.0 - 0.2 %    Comment: Performed at [facility], [address] [place], [place] [address]  Urinalysis, Complete w Microscopic     Status: Abnormal   Collection Time: 08/18/18 12:37 PM  Result Value Ref Range   Color, Urine YELLOW (A) YELLOW   APPearance CLEAR (A) CLEAR   Specific Gravity, Urine 1.013 1.005 - 1.030   pH 5.0 5.0 - 8.0   Glucose, UA NEGATIVE NEGATIVE mg/dL   Hgb urine dipstick NEGATIVE NEGATIVE   Bilirubin Urine NEGATIVE NEGATIVE   Ketones, ur NEGATIVE NEGATIVE mg/dL   Protein, ur NEGATIVE NEGATIVE mg/dL   Nitrite NEGATIVE NEGATIVE   Leukocytes,Ua NEGATIVE NEGATIVE   RBC / HPF 0-5 0 - 5 RBC/hpf   WBC, UA 0-5 0 - 5 WBC/hpf   Bacteria, UA NONE SEEN NONE SEEN   Squamous Epithelial / LPF 0-5 0 - 5   Mucus PRESENT    Hyaline Casts, UA PRESENT     Comment: Performed at [facility], [address] [place], [place] [address]  Troponin I - ONCE - STAT     Status:  Abnormal   Collection Time: 08/18/18 12:37 PM  Result Value Ref Range   Troponin I 0.03 (HH) <0.03 ng/mL    Comment: CRITICAL RESULT CALLED TO, READ BACK BY AND VERIFIED WITH  KIM GAULT AT 1311 08/18/2018 SDR Performed at [facility], [address] [place], [place] [address]   CK     Status: Abnormal   Collection Time: 08/18/18 12:37 PM  Result Value Ref Range   Total CK 40 (L) 49 - 397 U/L    Comment: Performed at [facility], [address] [place], [place] [address]  Lactic acid, plasma     Status: None   Collection Time: 08/18/18  1:24 PM  Result Value Ref Range   Lactic Acid, Venous 0.5 0.5 - 1.9 mmol/L    Comment: Performed at [facility], [address], [place], [place] [address]  SARS Coronavirus 2 (CEPHEID - Performed in [place] hospital lab), Hosp Order     Status: None   Collection Time: 08/18/18  1:24 PM  Result Value Ref Range   SARS Coronavirus 2 NEGATIVE NEGATIVE    Comment: (NOTE) If result is NEGATIVE SARS-CoV-2 target nucleic acids are NOT DETECTED. The SARS-CoV-2 RNA is generally detectable in upper and lower  respiratory specimens during the acute phase of infection. The lowest  concentration of SARS-CoV-2 viral copies this assay can detect is 250  copies / mL. A negative result does not preclude SARS-CoV-2 infection  and should not be used as the sole basis for treatment or other  patient management decisions.  A negative result may occur with  improper specimen collection / handling, submission of specimen other  than nasopharyngeal swab, presence of viral mutation(s) within the  areas targeted by this assay, and inadequate number of viral copies  (<250 copies / mL). A negative result must be combined with clinical  observations, patient history, and epidemiological information. If result is POSITIVE SARS-CoV-2 target nucleic acids are DETECTED. The SARS-CoV-2 RNA is generally detectable in upper and lower   respiratory specimens dur ing the acute phase of infection.  Positive  results are indicative of active infection with SARS-CoV-2.  Clinical  correlation with patient history and other diagnostic information is  necessary to determine patient infection status.  Positive results do  not  rule out bacterial infection or co-infection with other viruses. If result is PRESUMPTIVE POSTIVE SARS-CoV-2 nucleic acids MAY BE PRESENT.   A presumptive positive result was obtained on the submitted specimen  and confirmed on repeat testing.  While 2019 novel coronavirus  (SARS-CoV-2) nucleic acids may be present in the submitted sample  additional confirmatory testing may be necessary for epidemiological  and / or clinical management purposes  to differentiate between  SARS-CoV-2 and other Sarbecovirus currently known to infect humans.  If clinically indicated additional testing with an alternate test  methodology [phone] is advised. The SARS-CoV-2 RNA is generally  detectable in upper and lower respiratory sp ecimens during the acute  phase of infection. The expected result is Negative. Fact Sheet for Patients:  BoilerBrush.com.cyhttps://www.fda.gov/media/136312/download Fact Sheet for Healthcare Providers: https://pope.com/https://www.fda.gov/media/136313/download This test is not yet approved or cleared by the [place] FDA and has been authorized for detection and/or diagnosis of SARS-CoV-2 by FDA under an Emergency Use Authorization (EUA).  This EUA will remain in effect (meaning this test can be used) for the duration of the COVID-19 declaration under Section 564(b)(1) of the Act, 21 U.S.C. section 360bbb-3(b)(1), unless the authorization is terminated or revoked sooner. Performed at [facility], [address] [place], [place] [address]    Dg Chest Port 1 View  Result Date: 08/18/2018 CLINICAL DATA:  Weakness and nausea for 2 days. EXAM: PORTABLE CHEST 1 VIEW COMPARISON:  In single-view of the chest  09/10/2017 and 12/14/2015. CT chest 12/14/2015. FINDINGS: Pacing device and stent in the superior vena cava are unchanged. There is cardiomegaly without edema. No consolidative process, pneumothorax or effusion. No acute or focal bony abnormality. IMPRESSION: Cardiomegaly without acute disease. Electronically Signed   By: Drusilla Kannerhomas  Dalessio M.D.   On: 08/18/2018 13:22    Pending Labs Unresulted Labs (From admission, onward)    Start     Ordered   08/18/18 1239  Lactic acid, plasma  Now then every 2 hours,   STAT     08/18/18 1238   Signed and Held  CBC  (heparin)  Once,   R    Comments:  Baseline for heparin therapy IF NOT ALREADY DRAWN.  Notify MD if PLT < 100 K.    Signed and Held   Signed and Held  Creatinine, serum  (heparin)  Once,   R    Comments:  Baseline for heparin therapy IF NOT ALREADY DRAWN.    Signed and Held          Vitals/Pain Today's Vitals   08/18/18 1505 08/18/18 1600 08/18/18 1630 08/18/18 1631  BP: (!) 85/52 (!) 89/62 94/69   Pulse: 63   64  Resp: (!) 25 20 17 14   Temp:      SpO2: 97%   100%  Weight:      Height:      PainSc:        Isolation Precautions No active isolations  Medications Medications  sodium chloride flush (NS) 0.9 % injection 3 mL (3 mLs Intravenous Given 08/18/18 1238)  sodium chloride 0.9 % bolus 1,000 mL (0 mLs Intravenous Stopped 08/18/18 1354)  0.9 %  sodium chloride infusion ( Intravenous Stopped 08/18/18 1638)    Mobility walks Low fall risk   Focused Assessments    R Recommendations: See Admitting Provider Note  Report given to: Donald PoreJorge, RN

## 2018-08-18 NOTE — ED Triage Notes (Signed)
Weakness and nausea x 2 days. Denies chest pain or sob. Denies fever or cough. States has nausea but no emesis.

## 2018-08-19 ENCOUNTER — Inpatient Hospital Stay: Payer: Self-pay

## 2018-08-19 ENCOUNTER — Inpatient Hospital Stay
Admit: 2018-08-19 | Discharge: 2018-08-19 | Disposition: A | Discharge status: Home / Self Care | Payer: Self-pay | Attending: Family Medicine | Admitting: Family Medicine

## 2018-08-19 LAB — BASIC METABOLIC PANEL
Anion gap: 7 (ref 5–15)
Anion gap: 5 (ref 5–15)
BUN: 73 mg/dL — ABNORMAL HIGH (ref 6–20)
BUN: 59 mg/dL — ABNORMAL HIGH (ref 6–20)
CO2: 15 mmol/L — ABNORMAL LOW (ref 22–32)
CO2: 16 mmol/L — ABNORMAL LOW (ref 22–32)
Calcium: 8.9 mg/dL (ref 8.9–10.3)
Calcium: 8.7 mg/dL — ABNORMAL LOW (ref 8.9–10.3)
Chloride: 115 mmol/L — ABNORMAL HIGH (ref 98–111)
Chloride: 116 mmol/L — ABNORMAL HIGH (ref 98–111)
Creatinine, Ser: 2.21 mg/dL — ABNORMAL HIGH (ref 0.61–1.24)
Creatinine, Ser: 1.69 mg/dL — ABNORMAL HIGH (ref 0.61–1.24)
GFR calc Af Amer: 39 mL/min — ABNORMAL LOW (ref >60)
GFR calc Af Amer: 54 mL/min — ABNORMAL LOW (ref >60)
GFR calc non Af Amer: 34 mL/min — ABNORMAL LOW (ref >60)
GFR calc non Af Amer: 47 mL/min — ABNORMAL LOW (ref >60)
Glucose, Bld: 125 mg/dL — ABNORMAL HIGH (ref 70–99)
Glucose, Bld: 92 mg/dL (ref 70–99)
Potassium: 5.3 mmol/L — ABNORMAL HIGH (ref 3.5–5.1)
Potassium: 5.6 mmol/L — ABNORMAL HIGH (ref 3.5–5.1)
Sodium: 137 mmol/L (ref 135–145)
Sodium: 137 mmol/L (ref 135–145)

## 2018-08-19 LAB — TROPONIN I: Troponin I: 0.04 ng/mL (ref <0.03)

## 2018-08-19 LAB — CK: Total CK: 38 U/L — ABNORMAL LOW (ref 49–397)

## 2018-08-19 LAB — ECHOCARDIOGRAM COMPLETE
Height: 67 in
Weight: 2880 oz

## 2018-08-19 MED ORDER — PATIROMER SORBITEX CALCIUM 8.4 G PO PACK
8.4000 g | PACK | Freq: Every day | ORAL | Status: DC
  Administered 2018-08-19: 8.4 g via ORAL
  Filled 2018-08-19 (×4): qty 1

## 2018-08-19 MED ORDER — SODIUM CHLORIDE 0.9 % IV BOLUS
1000.0000 mL | Freq: Once | INTRAVENOUS | Status: AC
  Administered 2018-08-19: 1000 mL via INTRAVENOUS

## 2018-08-19 NOTE — Progress Notes (Signed)
1L of IV fluid bolus given today. BP has improved. Veltassa given for elevated potassium level.

## 2018-08-19 NOTE — Progress Notes (Signed)
MD notified: Would you like order BMP lab to recheck potassium level since it was 5.2 yesterday. CK yesterday was 40, troponin >0.03

## 2018-08-19 NOTE — Progress Notes (Signed)
MD notified: Do you want to order fluid bolus again this patients blood pressure dropped to 79/, HR is 60.

## 2018-08-19 NOTE — Progress Notes (Signed)
*  PRELIMINARY RESULTS* Echocardiogram 2D Echocardiogram has been performed.  Timothy Oconnell 08/19/2018, 11:13 AM

## 2018-08-19 NOTE — Progress Notes (Signed)
[facility]  ROUNDING NOTE   Subjective:  Patient well-known to us from prior admission in July 2019. At that time he had an episode of acute kidney injury which was felt to be secondary to volume depletion. Patient presents similarly now and his creatinine was 3.85 upon admission. With IV fluid hydration creatinine now down to 2.2. Potassium is a bit high at 5.3. Also has metabolic acidosis with a serum bicarbonate of 15.   Objective:  Vital signs in last 24 hours:  Temp:  [97.5 F (36.4 C)-98 F (36.7 C)] 97.5 F (36.4 C) (06/09 1440) Pulse Rate:  [60-75] 64 (06/09 1440) Resp:  [14-20] 18 (06/09 1440) BP: (70-115)/(49-69) 115/65 (06/09 1440) SpO2:  [97 %-100 %] 100 % (06/09 1440)  Weight change:  Filed Weights   08/18/18 1227  Weight: 81.6 kg    Intake/Output: I/O last 3 completed shifts: In: 301.3 [I.V.:301.3] Out: -    Intake/Output this shift:  Total I/O In: 600 [P.O.:600] Out: 600 [Urine:600]  Physical Exam: General: No acute distress  Head: Normocephalic, atraumatic. Moist oral mucosal membranes  Eyes: Anicteric  Neck: Supple, trachea midline  Lungs:  Clear to auscultation, normal effort  Heart: S1S2 no rubs  Abdomen:  Soft, nontender, bowel sounds present  Extremities: No peripheral edema.  Neurologic: Awake, alert, following commands  Skin: No lesions       Basic Metabolic Panel: Recent Labs  Lab 08/18/18 1237 08/18/18 1742 08/19/18 0914  NA 133*  --  137  K 5.2*  --  5.3*  CL 110  --  115*  CO2 13*  --  15*  GLUCOSE 140*  --  125*  BUN 85*  --  73*  CREATININE 3.85* 2.91* 2.21*  CALCIUM 8.8*  --  8.9    Liver Function Tests: No results for input(s): AST, ALT, ALKPHOS, BILITOT, PROT, ALBUMIN in the last 168 hours. No results for input(s): LIPASE, AMYLASE in the last 168 hours. No results for input(s): AMMONIA in the last 168 hours.  CBC: Recent Labs  Lab 08/18/18 1237 08/18/18 1742  WBC 6.9 8.2  HGB 11.4* 11.9*   HCT 34.8* 36.4*  MCV 84.1 85.4  PLT 124* 139*    Cardiac Enzymes: Recent Labs  Lab 08/18/18 1237 08/19/18 0914  CKTOTAL 40* 38*  TROPONINI 0.03* 0.04*    BNP: Invalid input(s): POCBNP  CBG: No results for input(s): GLUCAP in the last 168 hours.  Microbiology: Results for orders placed or performed during the hospital encounter of 08/18/18  SARS Coronavirus 2 (CEPHEID - Performed in [facility] hospital lab), Hosp Order     Status: None   Collection Time: 08/18/18  1:24 PM  Result Value Ref Range Status   SARS Coronavirus 2 NEGATIVE NEGATIVE Final    Comment: (NOTE) If result is NEGATIVE SARS-CoV-2 target nucleic acids are NOT DETECTED. The SARS-CoV-2 RNA is generally detectable in upper and lower  respiratory specimens during the acute phase of infection. The lowest  concentration of SARS-CoV-2 viral copies this assay can detect is 250  copies / mL. A negative result does not preclude SARS-CoV-2 infection  and should not be used as the sole basis for treatment or other  patient management decisions.  A negative result may occur with  improper specimen collection / handling, submission of specimen other  than nasopharyngeal swab, presence of viral mutation(s) within the  areas targeted by this assay, and inadequate number of viral copies  (<250 copies / mL). A negative result  must be combined with clinical  observations, patient history, and epidemiological information. If result is POSITIVE SARS-CoV-2 target nucleic acids are DETECTED. The SARS-CoV-2 RNA is generally detectable in upper and lower  respiratory specimens dur ing the acute phase of infection.  Positive  results are indicative of active infection with SARS-CoV-2.  Clinical  correlation with patient history and other diagnostic information is  necessary to determine patient infection status.  Positive results do  not rule out bacterial infection or co-infection with other viruses. If result is  PRESUMPTIVE POSTIVE SARS-CoV-2 nucleic acids MAY BE PRESENT.   A presumptive positive result was obtained on the submitted specimen  and confirmed on repeat testing.  While 2019 novel coronavirus  (SARS-CoV-2) nucleic acids may be present in the submitted sample  additional confirmatory testing may be necessary for epidemiological  and / or clinical management purposes  to differentiate between  SARS-CoV-2 and other Sarbecovirus currently known to infect humans.  If clinically indicated additional testing with an alternate test  methodology [phone]) is advised. The SARS-CoV-2 RNA is generally  detectable in upper and lower respiratory sp ecimens during the acute  phase of infection. The expected result is Negative. Fact Sheet for Patients:  StrictlyIdeas.no Fact Sheet for Healthcare Providers: BankingDealers.co.za This test is not yet approved or cleared by the [place] FDA and has been authorized for detection and/or diagnosis of SARS-CoV-2 by FDA under an Emergency Use Authorization (EUA).  This EUA will remain in effect (meaning this test can be used) for the duration of the COVID-19 declaration under Section 564(b)(1) of the Act, 21 U.S.C. section 360bbb-3(b)(1), unless the authorization is terminated or revoked sooner. Performed at [facility], [address] [place], [place] [address]     Coagulation Studies: No results for input(s): LABPROT, INR in the last 72 hours.  Urinalysis: Recent Labs    08/18/18 1237  COLORURINE YELLOW*  LABSPEC 1.013  PHURINE 5.0  GLUCOSEU NEGATIVE  HGBUR NEGATIVE  BILIRUBINUR NEGATIVE  KETONESUR NEGATIVE  PROTEINUR NEGATIVE  NITRITE NEGATIVE  LEUKOCYTESUR NEGATIVE      Imaging: US Renal  Result Date: 08/19/2018 CLINICAL DATA:  Acute kidney injury. EXAM: RENAL / URINARY TRACT ULTRASOUND COMPLETE COMPARISON:  09/11/2017 FINDINGS: Right Kidney: Renal measurements: 11.3 x  6.0 x 6.3 centimeters = volume: 221.5 mL . Echogenicity within normal limits. No mass or hydronephrosis visualized. Left Kidney: Renal measurements: 12.8 x 5.0 x 4.4 centimeters = volume: 146.4 mL. Echogenicity within normal limits. No mass or hydronephrosis visualized. Bladder: The bladder is normal in appearance. Note is made of mild enlargement of the prostate gland. IMPRESSION: 1. No evidence for renal mass or obstruction. 2. Mild enlargement of the prostate gland. Electronically Signed   By: Nolon Nations M.D.   On: 08/19/2018 12:37   Dg Chest Port 1 View  Result Date: 08/18/2018 CLINICAL DATA:  Weakness and nausea for 2 days. EXAM: PORTABLE CHEST 1 VIEW COMPARISON:  In single-view of the chest 09/10/2017 and 12/14/2015. CT chest 12/14/2015. FINDINGS: Pacing device and stent in the superior vena cava are unchanged. There is cardiomegaly without edema. No consolidative process, pneumothorax or effusion. No acute or focal bony abnormality. IMPRESSION: Cardiomegaly without acute disease. Electronically Signed   By: Inge Rise M.D.   On: 08/18/2018 13:22     Medications:    . aspirin EC  81 mg Oral Daily  . heparin  5,000 Units Subcutaneous Q8H  . multivitamin with minerals  1 tablet Oral Daily  . pantoprazole  40 mg Oral Daily   acetaminophen, albuterol, calcium carbonate  Assessment/ Plan:  48 y.o. 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  1.  Acute renal failure.  Patient is on multiple diuretics including furosemide and spironolactone.  In addition he is noted to be on meloxicam at home.  Baseline creatinine 1.1.  Renal ultrasound negative for hydronephrosis.  Agree with holding  diuretics for now.  Hesitant to give IV fluid hydration given his severe underlying heart failure.  2.  Metabolic acidosis.  Serum bicarbonate noted to be 15.  Hold off on sodium bicarbonate administration for now given history of heart failure.  3.  Hyperkalemia.  Serum potassium currently 5.3.  Start the patient on Veltassa 8.4 g p.o. daily.   LOS: 1 Aruna Nestler 6/9/20203:43 PM

## 2018-08-19 NOTE — Progress Notes (Signed)
MD notified: Potassium level is 5.3 now, and CK is 38, troponin is now 0.04

## 2018-08-19 NOTE — Progress Notes (Signed)
[facility] - [place] at [facility]   PATIENT NAME: Timothy OfferJohn Oconnell    MR#:  [id number]  DATE OF BIRTH:  1971/03/10  SUBJECTIVE: Patient is admitted for acute kidney injury, hypotension.  Patient says that he is feeling exhausted.  Says that he works at [employer], walks to work from home almost 2 miles every day.  And he also works at [employer] and walks about 3 miles.  Denies any shortness of breath, chest pain.  No extremity edema.  noTrouble urinating.  CHIEF COMPLAINT:   Chief Complaint  Patient presents with  . Weakness    REVIEW OF SYSTEMS:   ROS CONSTITUTIONAL: Complains of generalized weakness. EYES: No blurred or double vision.  EARS, NOSE, AND THROAT: No tinnitus or ear pain.  RESPIRATORY: No cough, shortness of breath, wheezing or hemoptysis.  CARDIOVASCULAR: No chest pain, orthopnea, edema.  GASTROINTESTINAL: No nausea, vomiting, diarrhea or abdominal pain.  GENITOURINARY: No dysuria, hematuria.  ENDOCRINE: No polyuria, nocturia,  HEMATOLOGY: No anemia, easy bruising or bleeding SKIN: No rash or lesion. MUSCULOSKELETAL: No joint pain or arthritis.   NEUROLOGIC: No tingling, numbness, weakness.  PSYCHIATRY: No anxiety or depression.   DRUG ALLERGIES:  No Known Allergies  VITALS:  Blood pressure (!) 79/57, pulse 60, temperature 98 F (36.7 C), temperature source Oral, resp. rate 18, height 5\' 7"  (1.702 m), weight 81.6 kg, SpO2 100 %.  PHYSICAL EXAMINATION:  GENERAL:  48 y.o.-year-old patient lying in the bed with no acute distress.  EYES: Pupils equal, round, reactive to light and accommodation. No scleral icterus. Extraocular muscles intact.  HEENT: Head atraumatic, normocephalic. Oropharynx and nasopharynx clear.  NECK:  Supple, no jugular venous distention. No thyroid enlargement, no tenderness.  LUNGS: Normal breath sounds bilaterally, no wheezing, rales,rhonchi or crepitation. No use of accessory muscles of respiration.   CARDIOVASCULAR: S1, S2 normal. No murmurs, rubs, or gallops.  ABDOMEN: Soft, nontender, nondistended. Bowel sounds present. No organomegaly or mass.  EXTREMITIES: No pedal edema, cyanosis, or clubbing.  NEUROLOGIC: Cranial nerves II through XII are intact. Muscle strength 5/5 in all extremities. Sensation intact. Gait not checked.  PSYCHIATRIC: The patient is alert and oriented x 3.  SKIN: No obvious rash, lesion, or ulcer.    LABORATORY PANEL:   CBC Recent Labs  Lab 08/18/18 1742  WBC 8.2  HGB 11.9*  HCT 36.4*  PLT 139*   ------------------------------------------------------------------------------------------------------------------  Chemistries  Recent Labs  Lab 08/19/18 0914  NA 137  K 5.3*  CL 115*  CO2 15*  GLUCOSE 125*  BUN 73*  CREATININE 2.21*  CALCIUM 8.9   ------------------------------------------------------------------------------------------------------------------  Cardiac Enzymes Recent Labs  Lab 08/19/18 0914  TROPONINI 0.04*   ------------------------------------------------------------------------------------------------------------------  RADIOLOGY:  [place] Renal  Result Date: 08/19/2018 CLINICAL DATA:  Acute kidney injury. EXAM: RENAL / URINARY TRACT ULTRASOUND COMPLETE COMPARISON:  09/11/2017 FINDINGS: Right Kidney: Renal measurements: 11.3 x 6.0 x 6.3 centimeters = volume: 221.5 mL . Echogenicity within normal limits. No mass or hydronephrosis visualized. Left Kidney: Renal measurements: 12.8 x 5.0 x 4.4 centimeters = volume: 146.4 mL. Echogenicity within normal limits. No mass or hydronephrosis visualized. Bladder: The bladder is normal in appearance. Note is made of mild enlargement of the prostate gland. IMPRESSION: 1. No evidence for renal mass or obstruction. 2. Mild enlargement of the prostate gland. Electronically Signed   By: Norva PavlovElizabeth  Brown M.D.   On: 08/19/2018 12:37   Dg Chest Port 1 View  Result Date: 08/18/2018 CLINICAL DATA:   Weakness  and nausea for 2 days. EXAM: PORTABLE CHEST 1 VIEW COMPARISON:  In single-view of the chest 09/10/2017 and 12/14/2015. CT chest 12/14/2015. FINDINGS: Pacing device and stent in the superior vena cava are unchanged. There is cardiomegaly without edema. No consolidative process, pneumothorax or effusion. No acute or focal bony abnormality. IMPRESSION: Cardiomegaly without acute disease. Electronically Signed   By: Inge Rise M.D.   On: 08/18/2018 13:22    EKG:   Orders placed or performed during the hospital encounter of 08/18/18  . ED EKG  . 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 intake and found to have acute kidney injury, hypotension.  #1 ,acute kidney injury and CKD stage II, ; continue IV fluids, avoid nephrotoxins, continue to hold Lasix, beta-blockers, spironolactone because of hypotension. 2.  Acute injury likely because of ATN with prerenal azotemia, continue IV fluids, nephrology is consulted 3.  Biventricular heart failure, patient previous echo done at [facility] with poor EF but could not tell about how much the ejection fraction was, echocardiogram today showed ejection fraction 30 to 35%.  Patient has slightly elevated troponin of 0.04, denies any chest pain, consult cardiology for fluid management as he also has heart failure.  #4 mild hyperkalemia watch closely potassium 5.3 with not on potassium supplements and watch closely. 5.  Acute kidney injury slightly better, creatinine down from  2.91-2.21 with IV fluids.  Patient was admitted in May of last year for similar problem.  All the records are reviewed and case discussed with Care Management/Social Workerr. Management plans discussed with the patient, family and they are in agreement.  CODE STATUS: Full code  TOTAL TIME TAKING CARE OF THIS PATIENT: 18minutes.  50% time spent in reviewing the charts, talking to the patient, counseling and coordination of care/. POSSIBLE D/C IN 1-2 DAYS, DEPENDING ON CLINICAL CONDITION.   [name] M.D on 08/19/2018 at 12:55 PM  Between 7am to 6pm - Pager - [phone]  After 6pm go to [email or web address] - password [email or web address] [place] [facility]  Office  [phone]  CC: Primary care physician; Patient, No Pcp Per   Note: This dictation was prepared with Dragon dictation along with smaller phrase technology. Any transcriptional errors that result from this process are unintentional.

## 2018-08-19 NOTE — Consult Note (Signed)
Timothy OfferJohn Oconnell is a 48 y.o. 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  He presented to the hospital with dehydration and renal insufficiency with probably prerenal azotemia secondary to dehydration and hypotension.  His blood pressure systolics been running in the 70s and 80s.  Patient denies any chest pain shortness of breath or dizziness.  He in fact wanted to has his COVID-19 testing done and that is why he went to the urgent care center and they referred to ER because of hypotension.  His COVID-19 test was negative.  He says he has been working very hard at [employer] and was feeling short of breath and exhausted.   Review of Systems: No chest pain shortness of breath or orthopnea or PND or dizziness or syncope.   Past Medical History:  Diagnosis Date  . Heart block   . History of Mustard procedure   . Pacemaker   . Transposition great arteries     Medications Prior to Admission  Medication Sig Dispense Refill  . aspirin EC 81 MG tablet Take 81 mg by mouth daily.    . carvedilol (COREG) 25 MG tablet Take 25 mg by mouth 2 (two) times daily.    . furosemide (LASIX) 40 MG tablet Take 40 mg by mouth daily.    Marland Kitchen. lisinopril (ZESTRIL) 20 MG tablet Take 20 mg by mouth daily.    . Multiple Vitamin (MULTIVITAMIN WITH MINERALS) TABS tablet Take 1 tablet by mouth daily.    Marland Kitchen. omeprazole (PRILOSEC OTC) 20 MG tablet Take 20 mg by mouth daily.    Marland Kitchen. spironolactone (ALDACTONE) 25 MG tablet Take 25 mg by mouth daily.    Marland Kitchen. acetaminophen (TYLENOL) 500 MG tablet Take 1,000 mg by mouth every 6 (six) hours as needed for pain.    Marland Kitchen. albuterol (PROVENTIL HFA;VENTOLIN HFA) 108 (90 Base)  MCG/ACT inhaler Inhale 2 puffs into the lungs every 6 (six) hours as needed for wheezing or shortness of breath. 1 Inhaler 2  . calcium carbonate (TUMS - DOSED IN MG ELEMENTAL CALCIUM) 500 MG chewable tablet Chew 1 tablet by mouth 2 (two) times daily as needed for indigestion or heartburn.       Marland Kitchen. aspirin EC  81 mg Oral Daily  . heparin  5,000 Units Subcutaneous Q8H  . multivitamin with minerals  1 tablet Oral Daily  . pantoprazole  40 mg Oral Daily    Infusions: . sodium chloride 50 mL/hr at 08/19/18 0100    No Known Allergies  Social History   Socioeconomic History  . Marital status: Married    Spouse name: Not on file  . Number of children: Not on file  . Years of education: Not on file  . Highest education level: Not on file  Occupational History  . Not on file  Social Needs  . Financial resource strain: Not on file  . Food insecurity:    Worry: Not on file    Inability: Not on file  . Transportation needs:    Medical: Not on file    Non-medical: Not on file  Tobacco Use  . Smoking status: Never Smoker  . Smokeless tobacco: Never Used  Substance and Sexual Activity  . Alcohol use: No  .  Drug use: Not on file  . Sexual activity: Not on file  Lifestyle  . Physical activity:    Days per week: Not on file    Minutes per session: Not on file  . Stress: Not on file  Relationships  . Social connections:    Talks on phone: Not on file    Gets together: Not on file    Attends religious service: Not on file    Active member of club or organization: Not on file    Attends meetings of clubs or organizations: Not on file    Relationship status: Not on file  . Intimate partner violence:    Fear of current or ex partner: Not on file    Emotionally abused: Not on file    Physically abused: Not on file    Forced sexual activity: Not on file  Other Topics Concern  . Not on file  Social History Narrative  . Not on file    Family History  Problem Relation Age of  Onset  . Lung cancer Father     PHYSICAL EXAM: Vitals:   08/19/18 0513 08/19/18 1201  BP: 107/61 (!) 79/57  Pulse: 61 60  Resp: 15 18  Temp: 97.6 F (36.4 C) 98 F (36.7 C)  SpO2: 99% 100%     Intake/Output Summary (Last 24 hours) at 08/19/2018 1423 Last data filed at 08/19/2018 1205 Gross per 24 hour  Intake 661.34 ml  Output 300 ml  Net 361.34 ml    General:  Well appearing. No respiratory difficulty HEENT: normal Neck: supple. no JVD. Carotids 2+ bilat; no bruits. No lymphadenopathy or thryomegaly appreciated. Cor: PMI nondisplaced. Regular rate & rhythm. No rubs, gallops or murmurs. Lungs: clear Abdomen: soft, nontender, nondistended. No hepatosplenomegaly. No bruits or masses. Good bowel sounds. Extremities: no cyanosis, clubbing, rash, edema Neuro: alert & oriented x 3, cranial nerves grossly intact. moves all 4 extremities w/o difficulty. Affect pleasant.  ECG: AV sequential paced rhythm  Results for orders placed or performed during the hospital encounter of 08/18/18 (from the past 24 hour(s))  Lactic acid, plasma     Status: None   Collection Time: 08/18/18  5:42 PM  Result Value Ref Range   Lactic Acid, Venous 1.2 0.5 - 1.9 mmol/L  CBC     Status: Abnormal   Collection Time: 08/18/18  5:42 PM  Result Value Ref Range   WBC 8.2 4.0 - 10.5 K/uL   RBC 4.26 4.22 - 5.81 MIL/uL   Hemoglobin 11.9 (L) 13.0 - 17.0 g/dL   HCT 45.436.4 (L) 09.839.0 - 11.952.0 %   MCV 85.4 80.0 - 100.0 fL   MCH 27.9 26.0 - 34.0 pg   MCHC 32.7 30.0 - 36.0 g/dL   RDW 14.717.4 (H) 82.911.5 - 56.215.5 %   Platelets 139 (L) 150 - 400 K/uL   nRBC 0.0 0.0 - 0.2 %  Creatinine, serum     Status: Abnormal   Collection Time: 08/18/18  5:42 PM  Result Value Ref Range   Creatinine, Ser 2.91 (H) 0.61 - 1.24 mg/dL   GFR calc non Af Amer 24 (L) >60 mL/min   GFR calc Af Amer 28 (L) >60 mL/min  Basic metabolic panel     Status: Abnormal   Collection Time: 08/19/18  9:14 AM  Result Value Ref Range   Sodium 137 135 - 145  mmol/L   Potassium 5.3 (H) 3.5 - 5.1 mmol/L   Chloride 115 (H) 98 - 111 mmol/L  CO2 15 (L) 22 - 32 mmol/L   Glucose, Bld 125 (H) 70 - 99 mg/dL   BUN 73 (H) 6 - 20 mg/dL   Creatinine, Ser 2.21 (H) 0.61 - 1.24 mg/dL   Calcium 8.9 8.9 - 10.3 mg/dL   GFR calc non Af Amer 34 (L) >60 mL/min   GFR calc Af Amer 39 (L) >60 mL/min   Anion gap 7 5 - 15  CK     Status: Abnormal   Collection Time: 08/19/18  9:14 AM  Result Value Ref Range   Total CK 38 (L) 49 - 397 U/L  Troponin I - Once     Status: Abnormal   Collection Time: 08/19/18  9:14 AM  Result Value Ref Range   Troponin I 0.04 (HH) <0.03 ng/mL   US Renal  Result Date: 08/19/2018 CLINICAL DATA:  Acute kidney injury. EXAM: RENAL / URINARY TRACT ULTRASOUND COMPLETE COMPARISON:  09/11/2017 FINDINGS: Right Kidney: Renal measurements: 11.3 x 6.0 x 6.3 centimeters = volume: 221.5 mL . Echogenicity within normal limits. No mass or hydronephrosis visualized. Left Kidney: Renal measurements: 12.8 x 5.0 x 4.4 centimeters = volume: 146.4 mL. Echogenicity within normal limits. No mass or hydronephrosis visualized. Bladder: The bladder is normal in appearance. Note is made of mild enlargement of the prostate gland. IMPRESSION: 1. No evidence for renal mass or obstruction. 2. Mild enlargement of the prostate gland. Electronically Signed   By: Nolon Nations M.D.   On: 08/19/2018 12:37   Dg Chest Port 1 View  Result Date: 08/18/2018 CLINICAL DATA:  Weakness and nausea for 2 days. EXAM: PORTABLE CHEST 1 VIEW COMPARISON:  In single-view of the chest 09/10/2017 and 12/14/2015. CT chest 12/14/2015. FINDINGS: Pacing device and stent in the superior vena cava are unchanged. There is cardiomegaly without edema. No consolidative process, pneumothorax or effusion. No acute or focal bony abnormality. IMPRESSION: Cardiomegaly without acute disease. Electronically Signed   By: Inge Rise M.D.   On: 08/18/2018 13:22     ASSESSMENT AND PLAN: Prerenal azotemia  with dehydration and hypotension and creatinine over 2.  Agree with giving IV fluid to bring blood pressure systolic above 671 and repeat creatinine and BUN in the morning.  His echocardiogram showed left ventricular ejection fraction 30 to 35% with severely dilated right atrium and right ventricle with severe pulmonary hypertension.  He may need to be started on Entresto and carvedilol but patient is followed at [facility] and is reluctant to have any new medications started over here.  He also has an appointment with [facility] cardiology for pacemaker interrogation on Thursday.  He may need to reschedule that if he still here.  If his blood pressure normalizes and a creatinine improves it is better that no new medication be started here and he can follow-up at [facility] cardiology for further evaluation.  He is reluctant for [place] to start any new medications.  I explained that to the hospitalist group and they will also let cardiology group in the [facility] know that he is over here and his admitted mostly for medical reason not cardiac reason.  He has renal insufficiency and hypotension and creatinine is improved then he can be discharged with follow-up with cardiology in 1 on Thursday if possible.  Chanze Teagle A

## 2018-08-20 LAB — BASIC METABOLIC PANEL
Anion gap: 5 (ref 5–15)
BUN: 53 mg/dL — ABNORMAL HIGH (ref 6–20)
CO2: 16 mmol/L — ABNORMAL LOW (ref 22–32)
Calcium: 9 mg/dL (ref 8.9–10.3)
Chloride: 117 mmol/L — ABNORMAL HIGH (ref 98–111)
Creatinine, Ser: 1.44 mg/dL — ABNORMAL HIGH (ref 0.61–1.24)
GFR calc Af Amer: >60 mL/min (ref >60)
GFR calc non Af Amer: 57 mL/min — ABNORMAL LOW (ref >60)
Glucose, Bld: 107 mg/dL — ABNORMAL HIGH (ref 70–99)
Potassium: 5.3 mmol/L — ABNORMAL HIGH (ref 3.5–5.1)
Sodium: 138 mmol/L (ref 135–145)

## 2018-08-20 MED ORDER — SODIUM BICARBONATE 650 MG PO TABS
650.0000 mg | ORAL_TABLET | Freq: Two times a day (BID) | ORAL | Status: DC
  Administered 2018-08-20 – 2018-08-21 (×2): 650 mg via ORAL
  Filled 2018-08-20 (×2): qty 1

## 2018-08-20 NOTE — Plan of Care (Signed)

## 2018-08-20 NOTE — Progress Notes (Signed)
[facility]  ROUNDING NOTE   Subjective:  Renal function has significantly improved. BUN down to 53 with a creatinine of 1.4. Still has underlying metabolic acidosis with most recent serum bicarbonate of 16. Potassium currently 5.3.   Objective:  Vital signs in last 24 hours:  Temp:  [97.5 F (36.4 C)-98 F (36.7 C)] 98 F (36.7 C) (06/10 1142) Pulse Rate:  [57-64] 64 (06/10 1142) Resp:  [15-20] 15 (06/10 1142) BP: (88-115)/(53-65) 107/64 (06/10 1142) SpO2:  [97 %-100 %] 98 % (06/10 1142) Weight:  [85.1 kg] 85.1 kg (06/10 0415)  Weight change: 3.452 kg Filed Weights   08/18/18 1227 08/20/18 0415  Weight: 81.6 kg 85.1 kg    Intake/Output: I/O last 3 completed shifts: In: 901.3 [P.O.:600; I.V.:301.3] Out: 1225 [Urine:1225]   Intake/Output this shift:  Total I/O In: 240 [P.O.:240] Out: -   Physical Exam: General: No acute distress  Head: Normocephalic, atraumatic. Moist oral mucosal membranes  Eyes: Anicteric  Neck: Supple, trachea midline  Lungs:  Clear to auscultation, normal effort  Heart: S1S2 no rubs  Abdomen:  Soft, nontender, bowel sounds present  Extremities: No peripheral edema.  Neurologic: Awake, alert, following commands  Skin: No lesions       Basic Metabolic Panel: Recent Labs  Lab 08/18/18 1237 08/18/18 1742 08/19/18 0914 08/19/18 1714 08/20/18 0430  NA 133*  --  137 137 138  K 5.2*  --  5.3* 5.6* 5.3*  CL 110  --  115* 116* 117*  CO2 13*  --  15* 16* 16*  GLUCOSE 140*  --  125* 92 107*  BUN 85*  --  73* 59* 53*  CREATININE 3.85* 2.91* 2.21* 1.69* 1.44*  CALCIUM 8.8*  --  8.9 8.7* 9.0    Liver Function Tests: No results for input(s): AST, ALT, ALKPHOS, BILITOT, PROT, ALBUMIN in the last 168 hours. No results for input(s): LIPASE, AMYLASE in the last 168 hours. No results for input(s): AMMONIA in the last 168 hours.  CBC: Recent Labs  Lab 08/18/18 1237 08/18/18 1742  WBC 6.9 8.2  HGB 11.4* 11.9*  HCT 34.8*  36.4*  MCV 84.1 85.4  PLT 124* 139*    Cardiac Enzymes: Recent Labs  Lab 08/18/18 1237 08/19/18 0914  CKTOTAL 40* 38*  TROPONINI 0.03* 0.04*    BNP: Invalid input(s): POCBNP  CBG: No results for input(s): GLUCAP in the last 168 hours.  Microbiology: Results for orders placed or performed during the hospital encounter of 08/18/18  SARS Coronavirus 2 (CEPHEID - Performed in [facility] hospital lab), Hosp Order     Status: None   Collection Time: 08/18/18  1:24 PM  Result Value Ref Range Status   SARS Coronavirus 2 NEGATIVE NEGATIVE Final    Comment: (NOTE) If result is NEGATIVE SARS-CoV-2 target nucleic acids are NOT DETECTED. The SARS-CoV-2 RNA is generally detectable in upper and lower  respiratory specimens during the acute phase of infection. The lowest  concentration of SARS-CoV-2 viral copies this assay can detect is 250  copies / mL. A negative result does not preclude SARS-CoV-2 infection  and should not be used as the sole basis for treatment or other  patient management decisions.  A negative result may occur with  improper specimen collection / handling, submission of specimen other  than nasopharyngeal swab, presence of viral mutation(s) within the  areas targeted by this assay, and inadequate number of viral copies  (<250 copies / mL). A negative result must be combined with  clinical  observations, patient history, and epidemiological information. If result is POSITIVE SARS-CoV-2 target nucleic acids are DETECTED. The SARS-CoV-2 RNA is generally detectable in upper and lower  respiratory specimens dur ing the acute phase of infection.  Positive  results are indicative of active infection with SARS-CoV-2.  Clinical  correlation with patient history and other diagnostic information is  necessary to determine patient infection status.  Positive results do  not rule out bacterial infection or co-infection with other viruses. If result is PRESUMPTIVE  POSTIVE SARS-CoV-2 nucleic acids MAY BE PRESENT.   A presumptive positive result was obtained on the submitted specimen  and confirmed on repeat testing.  While 2019 novel coronavirus  (SARS-CoV-2) nucleic acids may be present in the submitted sample  additional confirmatory testing may be necessary for epidemiological  and / or clinical management purposes  to differentiate between  SARS-CoV-2 and other Sarbecovirus currently known to infect humans.  If clinically indicated additional testing with an alternate test  methodology [phone]) is advised. The SARS-CoV-2 RNA is generally  detectable in upper and lower respiratory sp ecimens during the acute  phase of infection. The expected result is Negative. Fact Sheet for Patients:  StrictlyIdeas.no Fact Sheet for Healthcare Providers: BankingDealers.co.za This test is not yet approved or cleared by the [place] FDA and has been authorized for detection and/or diagnosis of SARS-CoV-2 by FDA under an Emergency Use Authorization (EUA).  This EUA will remain in effect (meaning this test can be used) for the duration of the COVID-19 declaration under Section 564(b)(1) of the Act, 21 U.S.C. section 360bbb-3(b)(1), unless the authorization is terminated or revoked sooner. Performed at [facility], [address] [place], [place] [address]     Coagulation Studies: No results for input(s): LABPROT, INR in the last 72 hours.  Urinalysis: Recent Labs    08/18/18 1237  COLORURINE YELLOW*  LABSPEC 1.013  PHURINE 5.0  GLUCOSEU NEGATIVE  HGBUR NEGATIVE  BILIRUBINUR NEGATIVE  KETONESUR NEGATIVE  PROTEINUR NEGATIVE  NITRITE NEGATIVE  LEUKOCYTESUR NEGATIVE      Imaging: US Renal  Result Date: 08/19/2018 CLINICAL DATA:  Acute kidney injury. EXAM: RENAL / URINARY TRACT ULTRASOUND COMPLETE COMPARISON:  09/11/2017 FINDINGS: Right Kidney: Renal measurements: 11.3 x 6.0 x 6.3  centimeters = volume: 221.5 mL . Echogenicity within normal limits. No mass or hydronephrosis visualized. Left Kidney: Renal measurements: 12.8 x 5.0 x 4.4 centimeters = volume: 146.4 mL. Echogenicity within normal limits. No mass or hydronephrosis visualized. Bladder: The bladder is normal in appearance. Note is made of mild enlargement of the prostate gland. IMPRESSION: 1. No evidence for renal mass or obstruction. 2. Mild enlargement of the prostate gland. Electronically Signed   By: Nolon Nations M.D.   On: 08/19/2018 12:37   Dg Chest Port 1 View  Result Date: 08/18/2018 CLINICAL DATA:  Weakness and nausea for 2 days. EXAM: PORTABLE CHEST 1 VIEW COMPARISON:  In single-view of the chest 09/10/2017 and 12/14/2015. CT chest 12/14/2015. FINDINGS: Pacing device and stent in the superior vena cava are unchanged. There is cardiomegaly without edema. No consolidative process, pneumothorax or effusion. No acute or focal bony abnormality. IMPRESSION: Cardiomegaly without acute disease. Electronically Signed   By: Inge Rise M.D.   On: 08/18/2018 13:22     Medications:    . aspirin EC  81 mg Oral Daily  . heparin  5,000 Units Subcutaneous Q8H  . multivitamin with minerals  1 tablet Oral Daily  . pantoprazole  40 mg Oral  Daily  . patiromer  8.4 g Oral Daily   acetaminophen, albuterol, calcium carbonate  Assessment/ Plan:  48 y.o. 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  1.  Acute renal failure.  Patient was on multiple diuretics including furosemide and spironolactone.  In addition he is noted to be on meloxicam at home.  Baseline creatinine 1.1.  Renal ultrasound negative for hydronephrosis.   Agree with holding diuretics for now.  Hesitant to give IV fluid hydration given his severe underlying heart failure.  2.  Metabolic acidosis.  Acidosis is persistent.  Does not appear to be in heart failure at the moment.  Add sodium bicarbonate 650 mg p.o. twice daily.  3.  Hyperkalemia.  Potassium currently 5.3.  Patient maintained on Veltassa 8.4 g daily.  Treatment of acidosis should also help with this as well.   LOS: 2 Ramil [place]ging[place] 6/10/202012:27 PM

## 2018-08-20 NOTE — Progress Notes (Signed)
SUBJECTIVE: Feeling well, resting comfortably in bed.   Vitals:   08/19/18 2035 08/19/18 2124 08/20/18 0126 08/20/18 0415  BP: (!) 95/58 (!) 88/57 (!) 90/56 (!) 94/53  Pulse: 64 (!) 59 (!) 58 (!) 57  Resp: 20   16  Temp: 97.8 F (36.6 C)   98 F (36.7 C)  TempSrc: Oral   Oral  SpO2: 97%  97% 98%  Weight:    85.1 kg  Height:        Intake/Output Summary (Last 24 hours) at 08/20/2018 0944 Last data filed at 08/20/2018 0332 Gross per 24 hour  Intake 600 ml  Output 1125 ml  Net -525 ml    LABS: Basic Metabolic Panel: Recent Labs    08/19/18 1714 08/20/18 0430  NA 137 138  K 5.6* 5.3*  CL 116* 117*  CO2 16* 16*  GLUCOSE 92 107*  BUN 59* 53*  CREATININE 1.69* 1.44*  CALCIUM 8.7* 9.0   Liver Function Tests: No results for input(s): AST, ALT, ALKPHOS, BILITOT, PROT, ALBUMIN in the last 72 hours. No results for input(s): LIPASE, AMYLASE in the last 72 hours. CBC: Recent Labs    08/18/18 1237 08/18/18 1742  WBC 6.9 8.2  HGB 11.4* 11.9*  HCT 34.8* 36.4*  MCV 84.1 85.4  PLT 124* 139*   Cardiac Enzymes: Recent Labs    08/18/18 1237 08/19/18 0914  CKTOTAL 40* 38*  TROPONINI 0.03* 0.04*   BNP: Invalid input(s): POCBNP D-Dimer: No results for input(s): DDIMER in the last 72 hours. Hemoglobin A1C: No results for input(s): HGBA1C in the last 72 hours. Fasting Lipid Panel: No results for input(s): CHOL, HDL, LDLCALC, TRIG, CHOLHDL, LDLDIRECT in the last 72 hours. Thyroid Function Tests: No results for input(s): TSH, T4TOTAL, T3FREE, THYROIDAB in the last 72 hours.  Invalid input(s): FREET3 Anemia Panel: No results for input(s): VITAMINB12, FOLATE, FERRITIN, TIBC, IRON, RETICCTPCT in the last 72 hours.   PHYSICAL EXAM General: Well developed, well nourished, in no acute distress HEENT:  Normocephalic and atramatic Neck:  No JVD.  Lungs: Clear bilaterally to auscultation and percussion. Heart: HRRR . Normal S1 and S2 without gallops or murmurs.  Abdomen:  Bowel sounds are positive, abdomen soft and non-tender  Msk:  Back normal, normal gait. Normal strength and tone for age. Extremities: No clubbing, cyanosis or edema.   Neuro: Alert and oriented X 3. Psych:  Good affect, responds appropriately  TELEMETRY: Ventricular pace rhythm, 60bpm  ASSESSMENT AND PLAN:  Prerenal azotemia with dehydration and hypotension. Creatinine is improving, down to 1.44 today, K remains sl elevated at 5.3  CHF: Echo shows LV dysfunction EF 30-35% and severe pHTN.  May discharge from cardiac perspective and if cleared from medical perspective. Has follow up appt with primary [facility]ologist at [facility] tomorrow.   Active Problems:   AKI (acute kidney injury) ([place])    Jake Bathe, NP-C 08/20/2018 9:44 AM Cell: [phone]

## 2018-08-20 NOTE — Progress Notes (Signed)
Timothy Oconnell at [place] NAME: Timothy Oconnell    MR#:  [id number]  DATE OF BIRTH:  April 22, 1970  SUBJECTIVE: Feeling better, denies any pain, had diarrhea.  But resolved now.  Kidney function also improved.  CHIEF COMPLAINT:   Chief Complaint  Patient presents with  . Weakness    REVIEW OF SYSTEMS:   ROS CONSTITUTIONAL:  says that his weakness is better than yesterday. EYES: No blurred or double vision.  EARS, NOSE, AND THROAT: No tinnitus or ear pain.  RESPIRATORY: No cough, shortness of breath, wheezing or hemoptysis.  CARDIOVASCULAR: No chest pain, orthopnea, edema.  GASTROINTESTINAL: No nausea, vomiting, diarrhea or abdominal pain.  GENITOURINARY: No dysuria, hematuria.  ENDOCRINE: No polyuria, nocturia,  HEMATOLOGY: No anemia, easy bruising or bleeding SKIN: No rash or lesion. MUSCULOSKELETAL: No joint pain or arthritis.   NEUROLOGIC: No tingling, numbness, weakness.  PSYCHIATRY: No anxiety or depression.   DRUG ALLERGIES:  No Known Allergies  VITALS:  Blood pressure 107/64, pulse 64, temperature 98 F (36.7 C), temperature source Oral, resp. rate 15, height 5\' 7"  (1.702 m), weight 85.1 kg, SpO2 98 %.  PHYSICAL EXAMINATION:  GENERAL:  48 y.o.-year-old patient lying in the bed with no acute distress.  EYES: Pupils equal, round, reactive to light and accommodation. No scleral icterus. Extraocular muscles intact.  HEENT: Head atraumatic, normocephalic. Oropharynx and nasopharynx clear.  NECK:  Supple, no jugular venous distention. No thyroid enlargement, no tenderness.  LUNGS: Normal breath sounds bilaterally, no wheezing, rales,rhonchi or crepitation. No use of accessory muscles of respiration.  CARDIOVASCULAR: S1, S2 normal. No murmurs, rubs, or gallops.  ABDOMEN: Soft, nontender, nondistended. Bowel sounds present. No organomegaly or mass.  EXTREMITIES: No pedal edema, cyanosis, or clubbing.  NEUROLOGIC: Cranial nerves II  through XII are intact. Muscle strength 5/5 in all extremities. Sensation intact. Gait not checked.  PSYCHIATRIC: The patient is alert and oriented x 3.  SKIN: No obvious rash, lesion, or ulcer.    LABORATORY PANEL:   CBC Recent Labs  Lab 08/18/18 1742  WBC 8.2  HGB 11.9*  HCT 36.4*  PLT 139*   ------------------------------------------------------------------------------------------------------------------  Chemistries  Recent Labs  Lab 08/20/18 0430  NA 138  K 5.3*  CL 117*  CO2 16*  GLUCOSE 107*  BUN 53*  CREATININE 1.44*  CALCIUM 9.0   ------------------------------------------------------------------------------------------------------------------  Cardiac Enzymes Recent Labs  Lab 08/19/18 0914  TROPONINI 0.04*   ------------------------------------------------------------------------------------------------------------------  RADIOLOGY:  US Renal  Result Date: 08/19/2018 CLINICAL DATA:  Acute kidney injury. EXAM: RENAL / URINARY TRACT ULTRASOUND COMPLETE COMPARISON:  09/11/2017 FINDINGS: Right Kidney: Renal measurements: 11.3 x 6.0 x 6.3 centimeters = volume: 221.5 mL . Echogenicity within normal limits. No mass or hydronephrosis visualized. Left Kidney: Renal measurements: 12.8 x 5.0 x 4.4 centimeters = volume: 146.4 mL. Echogenicity within normal limits. No mass or hydronephrosis visualized. Bladder: The bladder is normal in appearance. Note is made of mild enlargement of the prostate gland. IMPRESSION: 1. No evidence for renal mass or obstruction. 2. Mild enlargement of the prostate gland. Electronically Signed   By: Nolon Nations M.D.   On: 08/19/2018 12:37    EKG:   Orders placed or performed during the hospital encounter of 08/18/18  . ED EKG  . 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 intake and found to have acute kidney injury, hypotension.  #2.  Acute injury likely because of ATN with prerenal azotemia, improved, continue to hold Lasix, Aldactone.  Creatinine down from 2.21-1.4 today.  However patient still has hypotension.  Possible discharge tomorrow continue to hold diuretics, monitor kidney function, 3.  Biventricular heart failure,  Ef 35%.  Patient has slightly elevated troponin of 0.04, denies any chest pain, patient is seen by cardiology   #4 .hyperkalemia, received Veltassa, spoke with nephrology, recommended to discontinue all test has potassium decreased from 5.6-5.3. 5.  Hypotension, continue to hold diuretics at this time.  Recently patient's cardiologist increased her Coreg to 25 mg p.o. twice daily on May 12 from 6.25 mg p.o. twice daily.  He mentioned today that he had diarrhea also few days ago but resolved now states that he is stool is semisolid. All the records are reviewed and case discussed with Care Management/Social Workerr. Management plans discussed with the patient, family and they are in agreement.  CODE STATUS: Full code  TOTAL TIME TAKING CARE OF THIS PATIENT: 40minutes.  50% time spent in reviewing the charts, talking to the patient, counseling and coordination of care/. POSSIBLE D/C IN 1-2 DAYS, DEPENDING ON CLINICAL CONDITION.   Katha HammingSnehalatha Jawaun Celmer M.D on 08/20/2018 at 1:04 PM  Between 7am to 6pm - Pager - [phone]  After 6pm go to [email or web address] - password [email or web address]  [facility]  Office  [phone]  CC: Primary care physician; Patient, No Pcp Per   Note: This dictation was prepared with  Dragon dictation along with smaller phrase technology. Any transcriptional errors that result from this process are unintentional.

## 2018-08-21 LAB — BASIC METABOLIC PANEL
Anion gap: 7 (ref 5–15)
BUN: 30 mg/dL — ABNORMAL HIGH (ref 6–20)
CO2: 19 mmol/L — ABNORMAL LOW (ref 22–32)
Calcium: 9.2 mg/dL (ref 8.9–10.3)
Chloride: 112 mmol/L — ABNORMAL HIGH (ref 98–111)
Creatinine, Ser: 1.18 mg/dL (ref 0.61–1.24)
GFR calc Af Amer: >60 mL/min (ref >60)
GFR calc non Af Amer: >60 mL/min (ref >60)
Glucose, Bld: 191 mg/dL — ABNORMAL HIGH (ref 70–99)
Potassium: 4.9 mmol/L (ref 3.5–5.1)
Sodium: 138 mmol/L (ref 135–145)

## 2018-08-21 MED ORDER — CARVEDILOL 3.125 MG PO TABS
3.1250 mg | ORAL_TABLET | Freq: Two times a day (BID) | ORAL | Status: DC
  Filled 2018-08-21: qty 1

## 2018-08-21 MED ORDER — CARVEDILOL 3.125 MG PO TABS: 3.1250 mg | ORAL_TABLET | Freq: Two times a day (BID) | ORAL | 0 refills | Status: AC

## 2018-08-21 NOTE — Progress Notes (Signed)
     Timothy Oconnell was admitted to the Hospital on 08/18/2018 and Discharged  08/21/2018 and should be excused from work/school   for 4 days starting 08/18/2018 , may return to work/school without any restrictions.    [name] M.D on 08/21/2018,at 11:48 AM

## 2018-08-21 NOTE — Progress Notes (Signed)
     Timothy Oconnell was admitted to the Hospital on 08/18/2018 and Discharged  08/21/2018 and should be excused from work/school   COVID-19 test has been negative.  [name] M.D on 08/21/2018,at 11:49 AM

## 2018-08-21 NOTE — Discharge Summary (Signed)
Timothy Oconnell, is a 48 y.o. male  DOB 1970-09-15  MRN [id number].  Admission date:  08/18/2018  Admitting Physician  Bertrum SolMontell D Salary, MD  Discharge Date:  08/21/2018   Primary MD  Patient, No Pcp Per  Recommendations for primary care physician for things to follow:   Follow-up with primary cardiologist Dr.Krasuka.   Admission Diagnosis  Weakness [R53.1]   Discharge Diagnosis  Weakness [R53.1]    Active Problems:   AKI (acute kidney injury) [name]      Past Medical History:  Diagnosis Date  . Heart block   . History of Mustard procedure   . Pacemaker   . Transposition great arteries     Past Surgical History:  Procedure Laterality Date  . CARDIAC SURGERY    . EXPLORATION POST OPERATIVE OPEN HEART         History of present illness and  Hospital Course:     Kindly see H&P for history of present illness and admission details, please review complete Labs, Consult reports and Test reports for all details in brief  HPI  from the history and physical done on the day of admission 48 year old male patient with history of congenital heart disease status post surgery surgery within 1 year of birth for transposition of great arteries followed by congenital heart disease specialist at [facility] comes in because of generalized weakness and found to have acute kidney injury, hypotension admitted for the same.   Hospital Course  #1 acute renal failure, patient was on multiple diuretics including furosemide, Aldactone, patient had prerenal azotemia in the context of diuretics and also not drinking enough water in hot humid weather, he usually walks to his work about 2 miles each way.  His acute kidney injury improved with gentle IV hydration, patient's creatinine is down from 3.85-1.18.  GFR also improved from 17 to more than 60.   Patient advised the patient not to take with diuretics including Lasix, Aldactone until seen by cardiology and scheduled for telehealth visit. 2.  Chronic systolic heart failure, patient followed by Dr. Sima MatasKrasinski Richard [facility], spoke to him over the phone today, explained to him that he is admitted here for acute kidney injury, he mentioned that he is prone for either prerenal azotemia or fluid overload and he is very sensitive to that, mentioned that continue to hold diuretics, decrease the dose of Coreg to 3.125 mg p.o. twice daily instead of 25 mg p.o. twice daily.  Same explained to the patient that patient needs to take lower dose of Coreg until patient j gets evaluated by cardiologist.  Asked the patient to check weights daily. Continue to hold Lasix, Aldactone, ACE inhibitor's. Hospital course # 3 /history of complete heart block status post permanent pacemaker. D-TGA s/p Mustard procedure; Dilated cardiomyopathy  For hyperkalemia improved, received V[place], seen by nephrology.   diarrhea, patient did not tell that he had diarrhea on admission, told after 2 days of staying here, states that his diarrhea resolved.    Discharge Condition: Stable   Follow UP  Follow-up Information    [facility] Follow up.   Why: 48 year old male with history of congenital heart disease status post surgery and followed by [facility], patient can make appointment with Dr. Nemiah Commanderlukayode as a telehealth visit and their office will call him. Contact information: Spoke with Dr. Chyrl CivatteKrasuki            Discharge Instructions  and  Discharge Medications   Advised to have  on enough hydration. Work note given.  Patient's COVID-19 test has been negative. Follow-up at [facility] adult congenital cardiology clinic with the Dr. Richrd Humbles.spoke to [facility] today,and pt is aware that [facility] comes  One thursday every month. Allergies as of 08/21/2018   No Known Allergies      Medication List    STOP taking these medications   furosemide 40 MG tablet Commonly known as: LASIX   lisinopril 20 MG tablet Commonly known as: ZESTRIL   spironolactone 25 MG tablet Commonly known as: ALDACTONE     TAKE these medications   acetaminophen 500 MG tablet Commonly known as: TYLENOL Take 1,000 mg by mouth every 6 (six) hours as needed for pain.   albuterol 108 (90 Base) MCG/ACT inhaler Commonly known as: VENTOLIN HFA Inhale 2 puffs into the lungs every 6 (six) hours as needed for wheezing or shortness of breath.   aspirin EC 81 MG tablet Take 81 mg by mouth daily.   calcium carbonate 500 MG chewable tablet Commonly known as: TUMS - dosed in mg elemental calcium Chew 1 tablet by mouth 2 (two) times daily as needed for indigestion or heartburn.   carvedilol 3.125 MG tablet Commonly known as: COREG Take 1 tablet (3.125 mg total) by mouth 2 (two) times daily with a meal. What changed:   medication strength  how much to take  when to take this   multivitamin with minerals Tabs tablet Take 1 tablet by mouth daily.   omeprazole 20 MG tablet Commonly known as: PRILOSEC OTC Take 20 mg by mouth daily.         Diet and Activity recommendation: See Discharge Instructions above   Consults obtained -cardiology, nephrology.   Major procedures and Radiology Reports - PLEASE review detailed and final reports for all details, in brief -      US Renal  Result Date: 08/19/2018 CLINICAL DATA:  Acute kidney injury. EXAM: RENAL / URINARY TRACT ULTRASOUND COMPLETE COMPARISON:  09/11/2017 FINDINGS: Right Kidney: Renal measurements: 11.3 x 6.0 x 6.3 centimeters = volume: 221.5 mL . Echogenicity within normal limits. No mass or hydronephrosis visualized. Left Kidney: Renal measurements: 12.8 x 5.0 x 4.4 centimeters = volume: 146.4 mL. Echogenicity within normal limits. No mass or hydronephrosis visualized. Bladder: The bladder is normal in appearance. Note is made  of mild enlargement of the prostate gland. IMPRESSION: 1. No evidence for renal mass or obstruction. 2. Mild enlargement of the prostate gland. Electronically Signed   By: Nolon Nations M.D.   On: 08/19/2018 12:37   Dg Chest Port 1 View  Result Date: 08/18/2018 CLINICAL DATA:  Weakness and nausea for 2 days. EXAM: PORTABLE CHEST 1 VIEW COMPARISON:  In single-view of the chest 09/10/2017 and 12/14/2015. CT chest 12/14/2015. FINDINGS: Pacing device and stent in the superior vena cava are unchanged. There is cardiomegaly without edema. No consolidative process, pneumothorax or effusion. No acute or focal bony abnormality. IMPRESSION: Cardiomegaly without acute disease. Electronically Signed   By: Inge Rise M.D.   On: 08/18/2018 13:22    Micro Results     Recent Results (from the past 240 hour(s))  SARS Coronavirus 2 (CEPHEID - Performed in [place] hospital lab), Hosp Order     Status: None   Collection Time: 08/18/18  1:24 PM   Specimen: Nasopharyngeal Swab  Result Value Ref Range Status   SARS Coronavirus 2 NEGATIVE NEGATIVE Final    Comment: (NOTE) If result is NEGATIVE SARS-CoV-2 target nucleic acids are NOT  DETECTED. The SARS-CoV-2 RNA is generally detectable in upper and lower  respiratory specimens during the acute phase of infection. The lowest  concentration of SARS-CoV-2 viral copies this assay can detect is 250  copies / mL. A negative result does not preclude SARS-CoV-2 infection  and should not be used as the sole basis for treatment or other  patient management decisions.  A negative result may occur with  improper specimen collection / handling, submission of specimen other  than nasopharyngeal swab, presence of viral mutation(s) within the  areas targeted by this assay, and inadequate number of viral copies  (<250 copies / mL). A negative result must be combined with clinical  observations, patient history, and epidemiological information. If result is  POSITIVE SARS-CoV-2 target nucleic acids are DETECTED. The SARS-CoV-2 RNA is generally detectable in upper and lower  respiratory specimens dur ing the acute phase of infection.  Positive  results are indicative of active infection with SARS-CoV-2.  Clinical  correlation with patient history and other diagnostic information is  necessary to determine patient infection status.  Positive results do  not rule out bacterial infection or co-infection with other viruses. If result is PRESUMPTIVE POSTIVE SARS-CoV-2 nucleic acids MAY BE PRESENT.   A presumptive positive result was obtained on the submitted specimen  and confirmed on repeat testing.  While 2019 novel coronavirus  (SARS-CoV-2) nucleic acids may be present in the submitted sample  additional confirmatory testing may be necessary for epidemiological  and / or clinical management purposes  to differentiate between  SARS-CoV-2 and other Sarbecovirus currently known to infect humans.  If clinically indicated additional testing with an alternate test  methodology [phone]([id number]) is advised. The SARS-CoV-2 RNA is generally  detectable in upper and lower respiratory sp ecimens during the acute  phase of infection. The expected result is Negative. Fact Sheet for Patients:  BoilerBrush.com.cyhttps://www.fda.gov/media/136312/download Fact Sheet for Healthcare Providers: https://pope.com/https://www.fda.gov/media/136313/download This test is not yet approved or cleared by the [place] FDA and has been authorized for detection and/or diagnosis of SARS-CoV-2 by FDA under an Emergency Use Authorization (EUA).  This EUA will remain in effect (meaning this test can be used) for the duration of the COVID-19 declaration under Section 564(b)(1) of the Act, 21 U.S.C. section 360bbb-3(b)(1), unless the authorization is terminated or revoked sooner. Performed at [facility], [address] [place], [place] [address]        Today   Subjective:   Timothy Oconnell t  feels better, no shortness of breath, diarrhea. Objective:   Blood pressure 102/65, pulse 67, temperature 98.4 F (36.9 C), temperature source Oral, resp. rate 18, height 5\' 7"  (1.702 m), weight 83.4 kg, SpO2 98 %.   Intake/Output Summary (Last 24 hours) at 08/21/2018 1414 Last data filed at 08/21/2018 1100 Gross per 24 hour  Intake 240 ml  Output 400 ml  Net -160 ml    Exam Awake Alert, Oriented x 3, No new F.N deficits, Normal affect [place].AT,PERRAL Supple Neck,No JVD, No cervical lymphadenopathy appriciated.  Symmetrical Chest wall movement, Good air movement bilaterally, CTAB RRR,No Gallops,Rubs or new Murmurs, No Parasternal Heave +ve B.Sounds, Abd Soft, Non tender, No organomegaly appriciated, No rebound -guarding or rigidity. No Cyanosis, Clubbing or edema, No new Rash or bruise  Data Review   CBC w Diff:  Lab Results  Component Value Date   WBC 8.2 08/18/2018   HGB 11.9 (L) 08/18/2018   HGB 15.3 01/02/2014   HCT 36.4 (L) 08/18/2018   HCT 46.5  01/02/2014   PLT 139 (L) 08/18/2018   PLT 118 (L) 01/02/2014   LYMPHOPCT 10 09/10/2017   MONOPCT 8 09/10/2017   EOSPCT 1 09/10/2017   BASOPCT 1 09/10/2017    CMP:  Lab Results  Component Value Date   NA 138 08/21/2018   NA 142 01/02/2014   K 4.9 08/21/2018   K 4.4 01/02/2014   CL 112 (H) 08/21/2018   CL 108 (H) 01/02/2014   CO2 19 (L) 08/21/2018   CO2 28 01/02/2014   BUN 30 (H) 08/21/2018   BUN 19 (H) 01/02/2014   CREATININE 1.18 08/21/2018   CREATININE 1.15 01/02/2014   PROT 7.9 09/10/2017   ALBUMIN 4.1 09/10/2017   BILITOT 1.3 (H) 09/10/2017   ALKPHOS 58 09/10/2017   AST 20 09/10/2017   ALT 20 09/10/2017  .   Total Time in preparing paper work, data evaluation and todays exam - 35 minutes  Katha HammingSnehalatha Orvis Stann M.D on 08/21/2018 at 2:14 PM    Note: This dictation was prepared with Dragon dictation along with smaller phrase technology. Any transcriptional errors that result from this process are  unintentional.

## 2018-08-21 NOTE — Progress Notes (Signed)
SUBJECTIVE: Patient is feeling much better   Vitals:   08/20/18 0415 08/20/18 1142 08/20/18 1943 08/21/18 0427  BP: (!) 94/53 107/64 97/69 110/72  Pulse: (!) 57 64 74 66  Resp: 16 15 16 16   Temp: 98 F (36.7 C) 98 F (36.7 C) 98.5 F (36.9 C) 97.6 F (36.4 C)  TempSrc: Oral Oral Oral Oral  SpO2: 98% 98% 98% 98%  Weight: 85.1 kg     Height:        Intake/Output Summary (Last 24 hours) at 08/21/2018 1023 Last data filed at 08/21/2018 0441 Gross per 24 hour  Intake 240 ml  Output 400 ml  Net -160 ml    LABS: Basic Metabolic Panel: Recent Labs    08/19/18 1714 08/20/18 0430  NA 137 138  K 5.6* 5.3*  CL 116* 117*  CO2 16* 16*  GLUCOSE 92 107*  BUN 59* 53*  CREATININE 1.69* 1.44*  CALCIUM 8.7* 9.0   Liver Function Tests: No results for input(s): AST, ALT, ALKPHOS, BILITOT, PROT, ALBUMIN in the last 72 hours. No results for input(s): LIPASE, AMYLASE in the last 72 hours. CBC: Recent Labs    08/18/18 1237 08/18/18 1742  WBC 6.9 8.2  HGB 11.4* 11.9*  HCT 34.8* 36.4*  MCV 84.1 85.4  PLT 124* 139*   Cardiac Enzymes: Recent Labs    08/18/18 1237 08/19/18 0914  CKTOTAL 40* 38*  TROPONINI 0.03* 0.04*   BNP: Invalid input(s): POCBNP D-Dimer: No results for input(s): DDIMER in the last 72 hours. Hemoglobin A1C: No results for input(s): HGBA1C in the last 72 hours. Fasting Lipid Panel: No results for input(s): CHOL, HDL, LDLCALC, TRIG, CHOLHDL, LDLDIRECT in the last 72 hours. Thyroid Function Tests: No results for input(s): TSH, T4TOTAL, T3FREE, THYROIDAB in the last 72 hours.  Invalid input(s): FREET3 Anemia Panel: No results for input(s): VITAMINB12, FOLATE, FERRITIN, TIBC, IRON, RETICCTPCT in the last 72 hours.   PHYSICAL EXAM General: Well developed, well nourished, in no acute distress HEENT:  Normocephalic and atramatic Neck:  No JVD.  Lungs: Clear bilaterally to auscultation and percussion. Heart: HRRR . Normal S1 and S2 without gallops or  murmurs.  Abdomen: Bowel sounds are positive, abdomen soft and non-tender  Msk:  Back normal, normal gait. Normal strength and tone for age. Extremities: No clubbing, cyanosis or edema.   Neuro: Alert and oriented X 3. Psych:  Good affect, responds appropriately  TELEMETRY: AV sequential paced rhythm  ASSESSMENT AND PLAN: Renal failure which was acute due to probably dehydration and use of Lasix and Aldactone has significantly improved and the patient is feeling much better.  May go home without using diuretics with follow-up with his primary cardiology at [facility].  Active Problems:   AKI (acute kidney injury) ([place])    Timothy David, MD, [facility] 08/21/2018 10:23 AM

## 2020-03-10 IMAGING — US US RENAL
1 series · 14 of 25 positions shown · non-contrast
Comparison: 09/11/2017

CLINICAL DATA: Acute kidney injury.

EXAM:
RENAL / URINARY TRACT ULTRASOUND COMPLETE

[Series 1: us renal · 0.23mm/px · 14 of 35 slices shown]
[im 1/35]
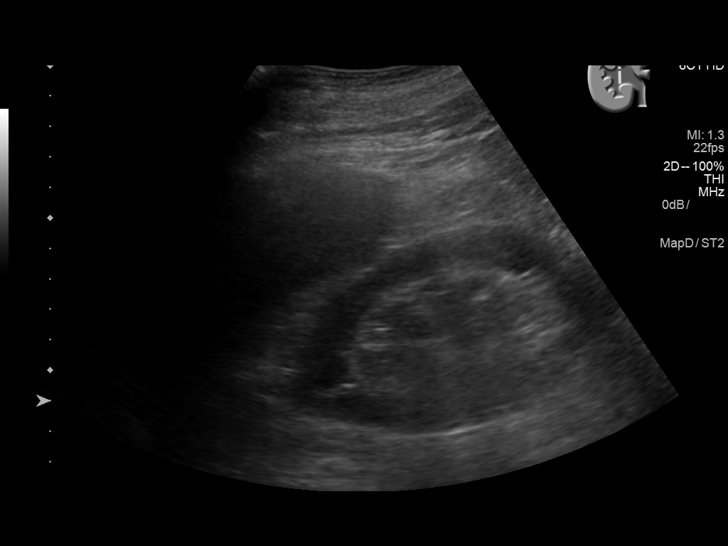
[im 3/35]
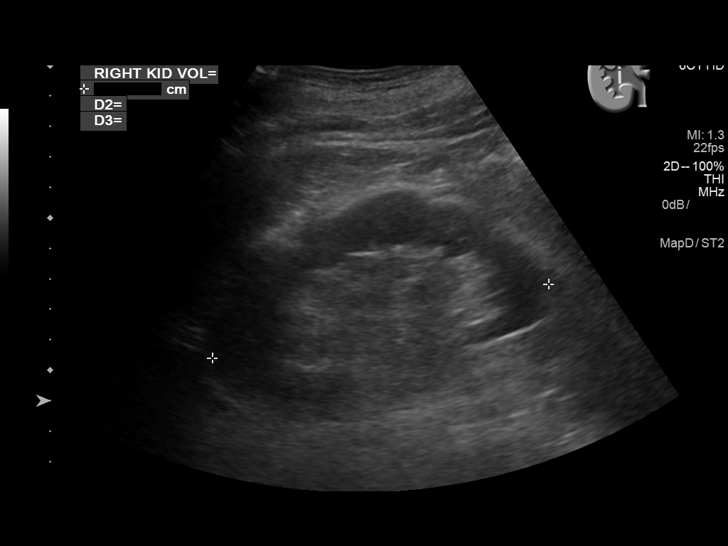
[im 6/35]
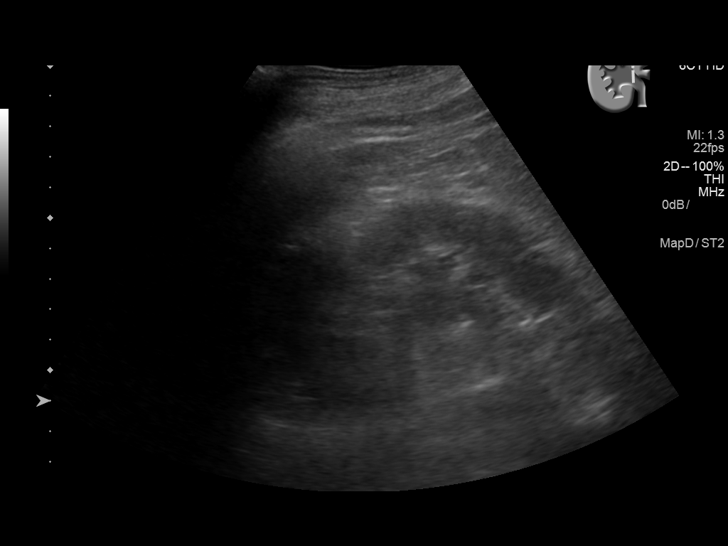
[im 9/35]
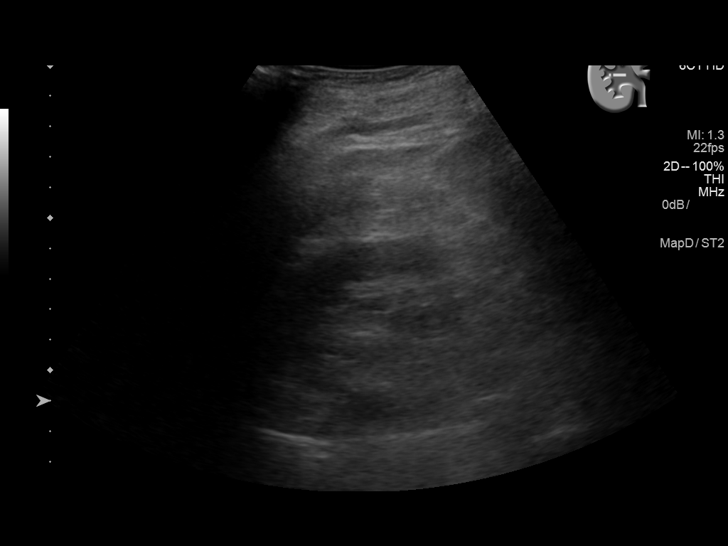
[im 12/35]
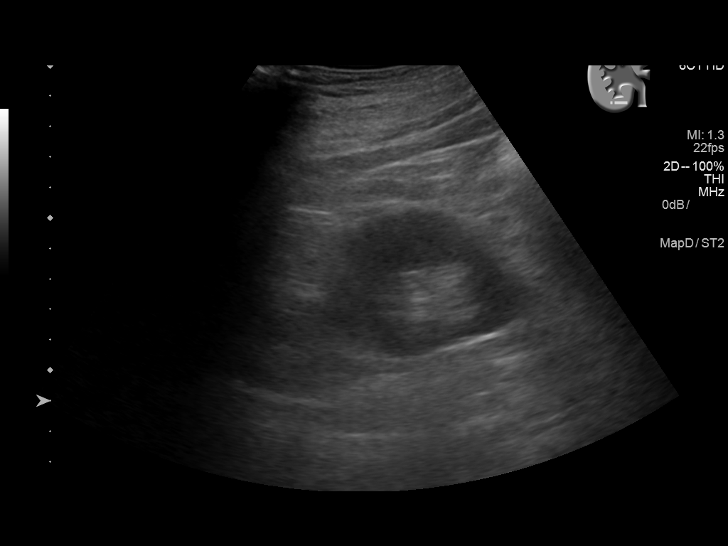
[im 13/35]
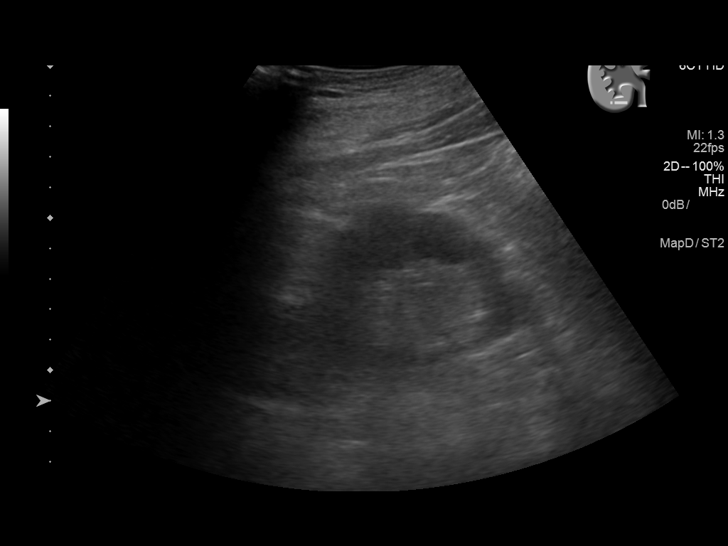
[im 16/35]
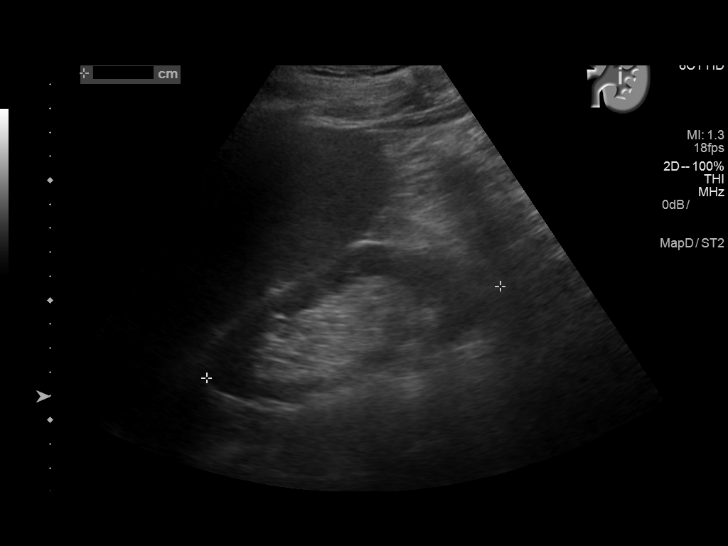
[im 19/35]
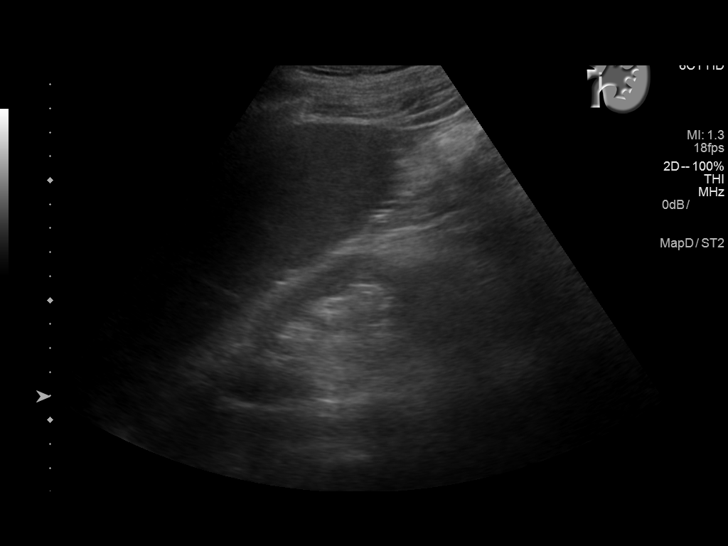
[im 22/35]
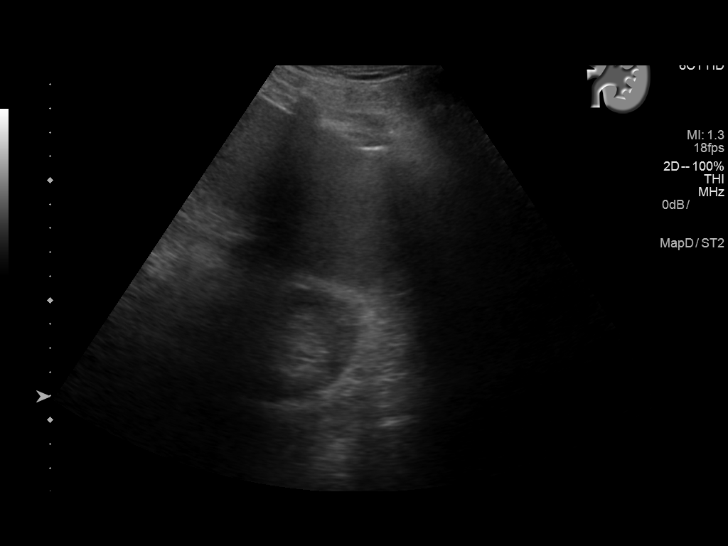
[im 23/35]
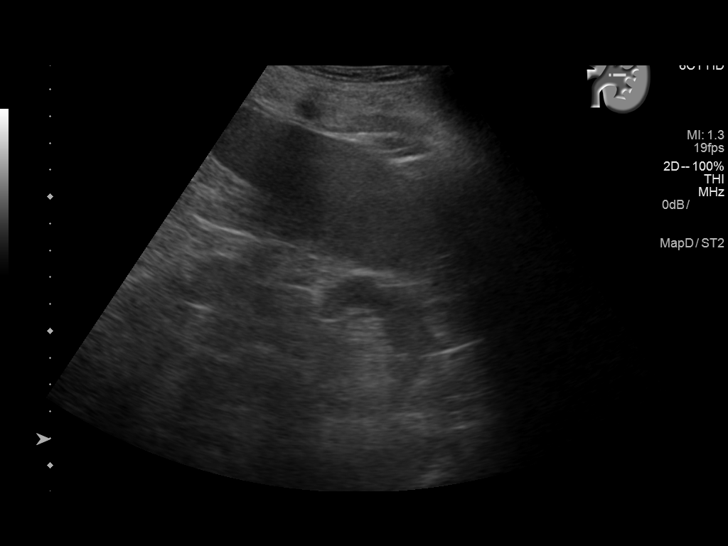
[im 26/35]
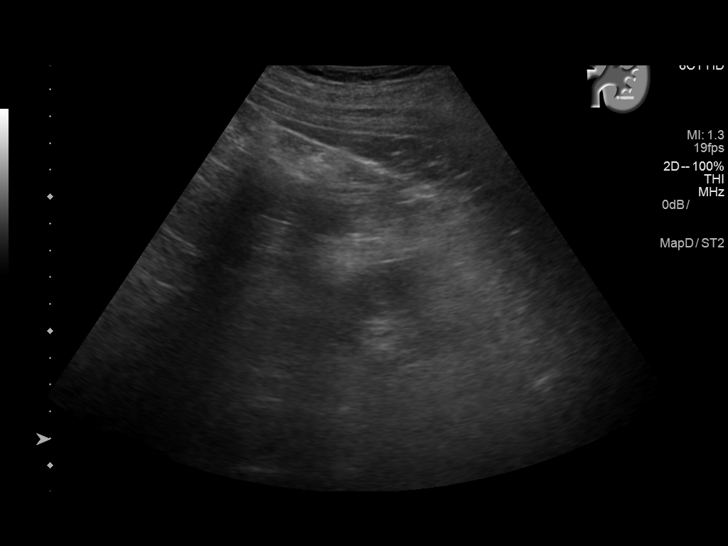
[im 29/35]
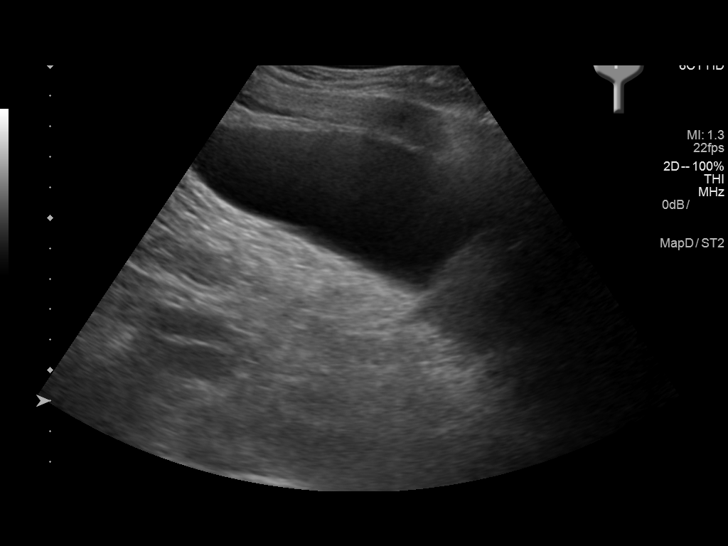
[im 32/35]
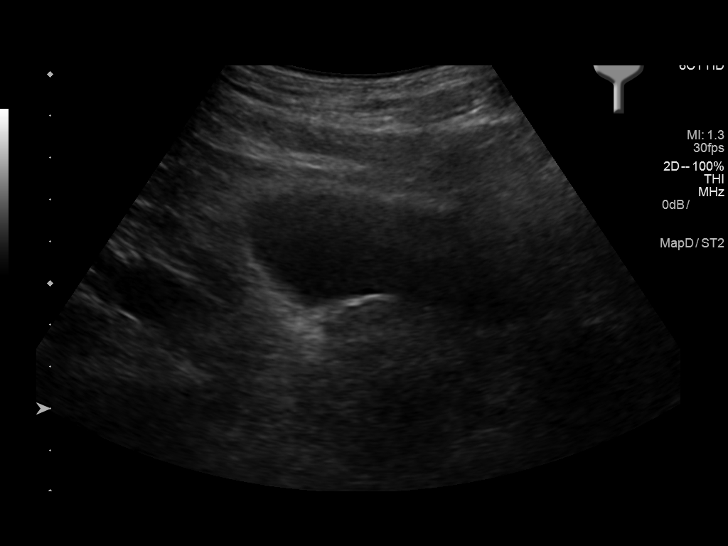
[im 35/35]
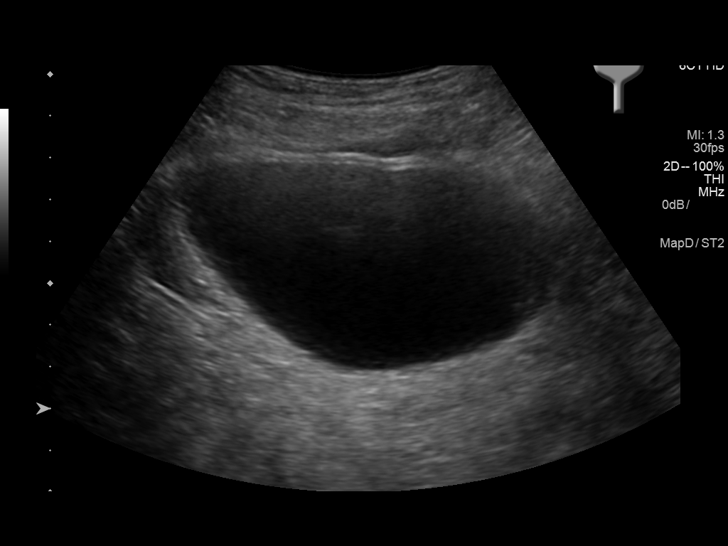

[14 of 25 positions shown; findings below may reference images not displayed]

FINDINGS: Right Kidney:

Renal measurements: 11.3 x 6.0 x 6.3 centimeters = volume: 221.5 mL
. Echogenicity within normal limits. No mass or hydronephrosis
visualized.

Left Kidney:

Renal measurements: 12.8 x 5.0 x 4.4 centimeters = volume: 146.4 mL.
Echogenicity within normal limits. No mass or hydronephrosis
visualized.

Bladder:

The bladder is normal in appearance. Note is made of mild
enlargement of the prostate gland.
IMPRESSION: 1. No evidence for renal mass or obstruction.
2. Mild enlargement of the prostate gland.

## 2020-06-10 DEATH — deceased
